# Patient Record
Sex: Female | Born: 1955 | Race: White | Hispanic: No | Marital: Married | Smoking: Never smoker
Health system: Southern US, Community
[De-identification: ages and names within clinical notes are randomized; demographics above are authoritative.]

## PROBLEM LIST (undated history)

## (undated) DIAGNOSIS — J45909 Unspecified asthma, uncomplicated: Secondary | ICD-10-CM

## (undated) DIAGNOSIS — E785 Hyperlipidemia, unspecified: Secondary | ICD-10-CM

## (undated) DIAGNOSIS — G43909 Migraine, unspecified, not intractable, without status migrainosus: Secondary | ICD-10-CM

## (undated) DIAGNOSIS — R000 Tachycardia, unspecified: Secondary | ICD-10-CM

## (undated) HISTORY — DX: Hyperlipidemia, unspecified: E78.5

## (undated) HISTORY — DX: Migraine, unspecified, not intractable, without status migrainosus: G43.909

## (undated) HISTORY — DX: Unspecified asthma, uncomplicated: J45.909

## (undated) HISTORY — DX: Tachycardia, unspecified: R00.0

---

## 1998-09-15 ENCOUNTER — Other Ambulatory Visit: Admission: RE | Admit: 1998-09-15 | Discharge: 1998-09-15 | Payer: Self-pay | Admitting: Obstetrics and Gynecology

## 1998-09-23 ENCOUNTER — Ambulatory Visit (HOSPITAL_BASED_OUTPATIENT_CLINIC_OR_DEPARTMENT_OTHER): Admission: RE | Admit: 1998-09-23 | Discharge: 1998-09-23 | Payer: Self-pay

## 2000-02-11 ENCOUNTER — Other Ambulatory Visit: Admission: RE | Admit: 2000-02-11 | Discharge: 2000-02-11 | Payer: Self-pay | Admitting: *Deleted

## 2000-03-24 ENCOUNTER — Encounter: Admission: RE | Admit: 2000-03-24 | Discharge: 2000-03-24 | Payer: Self-pay | Admitting: *Deleted

## 2000-03-24 ENCOUNTER — Encounter: Payer: Self-pay | Admitting: Allergy and Immunology

## 2001-03-20 ENCOUNTER — Other Ambulatory Visit: Admission: RE | Admit: 2001-03-20 | Discharge: 2001-03-20 | Payer: Self-pay | Admitting: Obstetrics and Gynecology

## 2001-05-12 ENCOUNTER — Encounter: Payer: Self-pay | Admitting: Emergency Medicine

## 2001-05-12 ENCOUNTER — Emergency Department (HOSPITAL_COMMUNITY): Admission: EM | Admit: 2001-05-12 | Discharge: 2001-05-12 | Payer: Self-pay | Admitting: Emergency Medicine

## 2002-12-10 ENCOUNTER — Other Ambulatory Visit: Admission: RE | Admit: 2002-12-10 | Discharge: 2002-12-10 | Payer: Self-pay | Admitting: Obstetrics and Gynecology

## 2002-12-12 ENCOUNTER — Encounter: Admission: RE | Admit: 2002-12-12 | Discharge: 2002-12-12 | Payer: Self-pay | Admitting: Obstetrics and Gynecology

## 2002-12-12 ENCOUNTER — Encounter: Payer: Self-pay | Admitting: Obstetrics and Gynecology

## 2005-05-09 ENCOUNTER — Encounter: Admission: RE | Admit: 2005-05-09 | Discharge: 2005-05-09 | Payer: Self-pay | Admitting: Orthopedic Surgery

## 2006-08-29 ENCOUNTER — Encounter: Admission: RE | Admit: 2006-08-29 | Discharge: 2006-08-29 | Payer: Self-pay | Admitting: Specialist

## 2007-04-06 ENCOUNTER — Ambulatory Visit (HOSPITAL_COMMUNITY): Admission: RE | Admit: 2007-04-06 | Discharge: 2007-04-07 | Payer: Self-pay | Admitting: Neurosurgery

## 2007-05-09 ENCOUNTER — Encounter: Admission: RE | Admit: 2007-05-09 | Discharge: 2007-05-09 | Payer: Self-pay | Admitting: Neurosurgery

## 2008-04-18 HISTORY — PX: OTHER SURGICAL HISTORY: SHX169

## 2009-09-03 ENCOUNTER — Encounter: Admission: RE | Admit: 2009-09-03 | Discharge: 2009-09-03 | Payer: Self-pay | Admitting: Neurosurgery

## 2010-07-31 ENCOUNTER — Emergency Department (HOSPITAL_COMMUNITY)
Admission: EM | Admit: 2010-07-31 | Discharge: 2010-07-31 | Disposition: A | Discharge status: Home / Self Care | Payer: No Typology Code available for payment source | Attending: Emergency Medicine | Admitting: Emergency Medicine

## 2010-07-31 ENCOUNTER — Emergency Department (HOSPITAL_COMMUNITY): Payer: No Typology Code available for payment source

## 2010-07-31 DIAGNOSIS — Y92410 Unspecified street and highway as the place of occurrence of the external cause: Secondary | ICD-10-CM | POA: Insufficient documentation

## 2010-07-31 DIAGNOSIS — Z9889 Other specified postprocedural states: Secondary | ICD-10-CM | POA: Insufficient documentation

## 2010-07-31 DIAGNOSIS — S139XXA Sprain of joints and ligaments of unspecified parts of neck, initial encounter: Secondary | ICD-10-CM | POA: Insufficient documentation

## 2010-08-31 NOTE — Op Note (Signed)
Megan, Peppel Michael                ACCOUNT NO.:  [id number]   MEDICAL RECORD NO.:  [id number]          PATIENT TYPE:  OIB   LOCATION:  3599                         FACILITY:  [facility]   PHYSICIAN:  Henry A. Pool, M.D.    DATE OF BIRTH:  09/22/1955   DATE OF PROCEDURE:  04/06/2007  DATE OF DISCHARGE:                               OPERATIVE REPORT   PREOPERATIVE DIAGNOSIS:  C5-C6 spondylosis with radiculopathy.   POSTOPERATIVE DIAGNOSIS:  C5-C6 spondylosis with radiculopathy.   PROCEDURE NOTE:  C5-C6 anterior cervical discectomy with C5-C6 anterior  interbody Prestige arthroplasty.   SURGEON:  Kathaleen Maser. Pool, M.D.   ASSISTANT:  Tia Alert, M.D.   ANESTHESIA:  General endotracheal anesthesia.   INDICATIONS FOR PROCEDURE:  Ms. Megan Michael is a 55 year old female with a  history of significant neck and left upper extremity pain failing  conservative management.  Workup done demonstrates evidence of  significant spondylosis with associated disc herniation off the left  side at C5-C6.  The patient has been counseled as to her options.  She  has decided to proceed with anterior cervical discectomy and  arthroplasty in hopes of improving her symptoms.   OPERATIVE NOTE:  The patient was taken to the operating room and placed  on the operating table in a supine position.  After an adequate level of  anesthesia was achieved, the patient was positioned supine with her neck  slightly extended and held in place with halter traction.  The patient's  anterior cervical region was prepped and draped sterilely.  A 10 blade  was used to make a linear skin incision overlying the C5-C6 interspace.  This was carried down sharply to the platysma.  The platysma was then  divided vertically and dissection proceeded on the medial border of the  sternomastoid muscle and carotid sheath.  The trachea and esophagus were  mobilized and retracted towards the left.  Prevertebral fascia was  stripped off the  anterior spinal column.  The longus coli muscle was  elevated bilaterally using electrocautery.  Deep self-retaining  retractor was placed.  Intraoperative fluoroscopy was used, the C5-C6  level was confirmed.  The disc space was then incised with a 15 blade in  rectangular fashion.  Wide disc space clean out was achieved using  pituitary rongeurs, forward and backward Carlen curettes, Kerrison  rongeurs, and high speed drill.  The predominance of the disc were  removed down to the level of the posterior annulus.  The microscope was  then used for the remainder of the discectomy.  The remaining aspects of  the annulus and osteophytes were removed using a high speed drill down  to the level of the posterior longitudinal ligament.  The posterior  longitudinal ligament was then elevated and resected in piecemeal  fashion using Kerrison rongeurs.  The underlying thecal sac was then  identified.  A wide central decompression was then performed by  undercutting the bodies of C5 and C6 using Kerrison rongeurs.  Decompression then proceeded out each neural foramen.  Wide anterior  foraminotomy was then performed along the course of the  exiting C6 nerve  roots bilaterally.  All elements of spondylitic protrusion and disc  herniation were completely resected.  At this point, a very thorough  decompression had been achieved.  Anterior osteophytes of the C5 and C6  vertebral bodies were removed using the high speed drill.  The disc  space was then sized and a 6 mm x 12 mm Prestige interbody arthroplasty  was then performed.  The device was impacted into place.  It was then  centered on the spine and attached using fixed angle screws, two each at  both C5 and C6.  All this was accomplished under fluoroscopic guidance.  The implant holder was then removed.  The plate appeared to be well  positioned.  The locking screws were then applied at both C5 and C6.  Final images revealed good position of the  implant with normal alignment  of the spine.  Intraoperative flexion of the patient's neck revealed  good movement at the operative level.  The wound was then inspected for  hemostasis which was found to be good, was irrigated one final time, and  closed in a typical fashion.  Steri-Strips and sterile dressings were  applied.  There were no complications.  The patient tolerated the  procedure well and she returns to the recovery room postoperatively.           ______________________________  Kathaleen Maser Pool, M.D.     HAP/MEDQ  D:  04/06/2007  T:  04/07/2007  Job:  [id number]

## 2011-01-21 LAB — DIFFERENTIAL
Basophils Absolute: 0
Basophils Relative: 1
Eosinophils Absolute: 0.1 — ABNORMAL LOW
Eosinophils Relative: 1
Lymphocytes Relative: 31
Lymphs Abs: 2.6
Monocytes Absolute: 0.6
Monocytes Relative: 8
Neutro Abs: 5
Neutrophils Relative %: 60

## 2011-01-21 LAB — CBC
HCT: 39.6
Hemoglobin: 13.5
MCHC: 34.1
MCV: 95.1
Platelets: 368
RBC: 4.17
RDW: 12.2
WBC: 8.3

## 2011-01-21 LAB — TYPE AND SCREEN
ABO/RH(D): O POS
Antibody Screen: NEGATIVE

## 2011-01-21 LAB — ABO/RH: ABO/RH(D): O POS

## 2013-12-09 ENCOUNTER — Encounter: Payer: Self-pay | Admitting: Cardiovascular Disease

## 2013-12-09 ENCOUNTER — Ambulatory Visit (INDEPENDENT_AMBULATORY_CARE_PROVIDER_SITE_OTHER): Payer: BLUE CROSS/BLUE SHIELD | Admitting: Cardiovascular Disease

## 2013-12-09 VITALS — BP 116/80 | HR 117 | Ht 65.0 in | Wt 140.8 lb

## 2013-12-09 DIAGNOSIS — R0989 Other specified symptoms and signs involving the circulatory and respiratory systems: Secondary | ICD-10-CM

## 2013-12-09 DIAGNOSIS — R0609 Other forms of dyspnea: Secondary | ICD-10-CM

## 2013-12-09 DIAGNOSIS — R000 Tachycardia, unspecified: Secondary | ICD-10-CM

## 2013-12-09 DIAGNOSIS — I498 Other specified cardiac arrhythmias: Secondary | ICD-10-CM

## 2013-12-09 DIAGNOSIS — R0600 Dyspnea, unspecified: Secondary | ICD-10-CM

## 2013-12-09 MED ORDER — METOPROLOL SUCCINATE ER 25 MG PO TB24: 25.0000 mg | ORAL_TABLET | Freq: Every day | ORAL | Status: DC

## 2013-12-09 NOTE — Assessment & Plan Note (Signed)
Megan Michael  presents for further evaluation of sinus tachycardia. At a faster rate for years. She's on several medications for chronic migraines and 1 for depression. The nortriptyline is known to cause tachycardia. Her doses been decreased slightly.  I would like  to get some basic labs including a TSH, basic metabolic profile, CBC, liver enzymes. We will also get an echocardiogram.  We will start her on toprol XL 25 mg a day.  I'll see her in 4-6 weeks for follow up visit.

## 2013-12-09 NOTE — Patient Instructions (Addendum)
Your physician recommends that you have lab work in: TODAY  Your physician has requested that you have an echocardiogram in 1-2 weeks. Echocardiography is a painless test that uses sound waves to create images of your heart. It provides your doctor with information about the size and shape of your heart and how well your heart's chambers and valves are working. This procedure takes approximately one hour. There are no restrictions for this procedure.  Your physician recommends that you schedule a follow-up appointment with Dr. Elease Hashimoto in: 4-6 weeks

## 2013-12-09 NOTE — Progress Notes (Signed)
     Megan Michael Date of Birth  Apr 28, 1955       [facility] Office [address]  [address] [place], [place]  [address]   [place], [place]  [address] [phone]     [phone]   Fax  [phone]     Fax [phone]  Problem List: 1. Sinus tachycardia 2.  Migraine headaches (chronic )  3.   History of Present Illness:  Megan Michael is a 58 yo who is seen for tachycardia.  She has noticed her HR is high.  She has lots of fatigue.  Also has chronic migraines. She does not get any regular exercise.  Does walk the dogs.    She has not had any heat or cold intolerance.  No unexplained weight gain or weight loss. No chronic diarrhea.     No current outpatient prescriptions on file prior to visit.   No current facility-administered medications on file prior to visit.    Allergies  Allergen Reactions  . Penicillins     RASH AND TONGUE SWELLING    Past Medical History  Diagnosis Date  . Migraines     Past Surgical History  Procedure Laterality Date  . Disk replacement  2010    CERVICAL SPINE     History  Smoking status  . Never Smoker   Smokeless tobacco  . Not on file    History  Alcohol Use: Not on file    Family History  Problem Relation Age of Onset  . Hypertension Mother   . Cancer Mother   . Diabetes Mellitus I Father   . Heart attack Father   . Cancer - Prostate Father   . Melanoma Father   . Diabetes Mellitus I Brother     Reviw of Systems:  Reviewed in the HPI.  All other systems are negative.  Physical Exam: Blood pressure 116/80, pulse 117, height  (1.651 m), weight 140 lb 12.8 oz (63.866 kg). Wt Readings from Last 3 Encounters:  12/09/13 140 lb 12.8 oz (63.866 kg)     General: Well developed, well nourished, in no acute distress.  Head: Normocephalic, atraumatic, sclera non-icteric, mucus membranes are moist,   Neck: Supple. Carotids are 2 + without bruits. No JVD   Lungs:  Clear   Heart: RR, normal S1S2, no murmur  Abdomen: Soft, non-tender, non-distended with normal bowel sounds.  Msk:  Strength and tone are normal   Extremities: No clubbing or cyanosis. No edema.  Distal pedal pulses are 2+ and equal    Neuro: CN II - XII intact.  Alert and oriented X 3.   Psych:  Normal   ECG: December 09, 2013  Sinus tachycardia at 117.  RAE.     Assessment / Plan:

## 2013-12-10 LAB — BASIC METABOLIC PANEL
BUN: 16 mg/dL (ref 6–23)
CO2: 26 mEq/L (ref 19–32)
Calcium: 9.6 mg/dL (ref 8.4–10.5)
Chloride: 105 mEq/L (ref 96–112)
Creatinine, Ser: 1.2 mg/dL (ref 0.4–1.2)
GFR: 48.82 mL/min — ABNORMAL LOW (ref >60.00)
Glucose, Bld: 90 mg/dL (ref 70–99)
Potassium: 3.7 mEq/L (ref 3.5–5.1)
Sodium: 139 mEq/L (ref 135–145)

## 2013-12-10 LAB — CBC WITH DIFFERENTIAL/PLATELET
Basophils Absolute: 0 10*3/uL (ref 0.0–0.1)
Basophils Relative: 0.5 % (ref 0.0–3.0)
Eosinophils Absolute: 0.1 10*3/uL (ref 0.0–0.7)
Eosinophils Relative: 1.4 % (ref 0.0–5.0)
HCT: 39 % (ref 36.0–46.0)
Hemoglobin: 13 g/dL (ref 12.0–15.0)
Lymphocytes Relative: 27.4 % (ref 12.0–46.0)
Lymphs Abs: 2.3 10*3/uL (ref 0.7–4.0)
MCHC: 33.3 g/dL (ref 30.0–36.0)
MCV: 96 fl (ref 78.0–100.0)
Monocytes Absolute: 0.4 10*3/uL (ref 0.1–1.0)
Monocytes Relative: 5.1 % (ref 3.0–12.0)
Neutro Abs: 5.6 10*3/uL (ref 1.4–7.7)
Neutrophils Relative %: 65.6 % (ref 43.0–77.0)
Platelets: 310 10*3/uL (ref 150.0–400.0)
RBC: 4.06 Mil/uL (ref 3.87–5.11)
RDW: 13 % (ref 11.5–15.5)
WBC: 8.5 10*3/uL (ref 4.0–10.5)

## 2013-12-10 LAB — HEPATIC FUNCTION PANEL
ALT: 21 U/L (ref 0–35)
AST: 23 U/L (ref 0–37)
Albumin: 4.5 g/dL (ref 3.5–5.2)
Alkaline Phosphatase: 69 U/L (ref 39–117)
Bilirubin, Direct: 0 mg/dL (ref 0.0–0.3)
Total Bilirubin: 0.4 mg/dL (ref 0.2–1.2)
Total Protein: 7.1 g/dL (ref 6.0–8.3)

## 2013-12-10 LAB — TSH: TSH: 0.79 u[IU]/mL (ref 0.35–4.50)

## 2013-12-19 ENCOUNTER — Ambulatory Visit (HOSPITAL_COMMUNITY): Payer: BC Managed Care – PPO | Attending: Cardiology | Admitting: Radiology

## 2013-12-19 DIAGNOSIS — R5381 Other malaise: Secondary | ICD-10-CM | POA: Insufficient documentation

## 2013-12-19 DIAGNOSIS — R9389 Abnormal findings on diagnostic imaging of other specified body structures: Secondary | ICD-10-CM | POA: Insufficient documentation

## 2013-12-19 DIAGNOSIS — I080 Rheumatic disorders of both mitral and aortic valves: Secondary | ICD-10-CM | POA: Insufficient documentation

## 2013-12-19 DIAGNOSIS — R0600 Dyspnea, unspecified: Secondary | ICD-10-CM

## 2013-12-19 DIAGNOSIS — R000 Tachycardia, unspecified: Secondary | ICD-10-CM | POA: Diagnosis present

## 2013-12-19 DIAGNOSIS — R5383 Other fatigue: Secondary | ICD-10-CM

## 2013-12-19 NOTE — Progress Notes (Signed)
Echocardiogram performed.  

## 2014-01-14 ENCOUNTER — Encounter: Payer: Self-pay | Admitting: Cardiovascular Disease

## 2014-01-14 ENCOUNTER — Ambulatory Visit (INDEPENDENT_AMBULATORY_CARE_PROVIDER_SITE_OTHER): Payer: BC Managed Care – PPO | Admitting: Cardiovascular Disease

## 2014-01-14 VITALS — BP 114/76 | HR 96 | Ht 65.0 in | Wt 141.0 lb

## 2014-01-14 DIAGNOSIS — R000 Tachycardia, unspecified: Secondary | ICD-10-CM

## 2014-01-14 DIAGNOSIS — I498 Other specified cardiac arrhythmias: Secondary | ICD-10-CM

## 2014-01-14 MED ORDER — METOPROLOL SUCCINATE ER 25 MG PO TB24: 75.0000 mg | ORAL_TABLET | Freq: Every day | ORAL | Status: DC

## 2014-01-14 NOTE — Assessment & Plan Note (Addendum)
Megan Hatchnn seems to be feeling a little bit better. Her rate has improved. Her neurologist increased the Toprol to 50 mg a day and she seems to be doing okay with that. We'll increase the Toprol 75 mg daily and continue to monitor her heart rate. I'll see her in 3 months for followup visit and EKG.

## 2014-01-14 NOTE — Patient Instructions (Signed)
Your physician has recommended you make the following change in your medication:  INCREASE Toprol to 75 mg once daily  Your physician recommends that you schedule a follow-up appointment in: 3 months with Dr. Elease HashimotoNahser

## 2014-01-14 NOTE — Progress Notes (Signed)
Megan DaltonAnn P Michael Date of Birth  Feb 15, 1956       [facility]    [place] Office [address]  [address] [place], [place]  [address]   [place]  [address] [phone]     [phone]   Fax  [phone]     Fax [phone]  Problem List: 1. Sinus tachycardia 2.  Migraine headaches (chronic )  3.   History of Present Illness:  Megan Michael is a 58 yo who is seen for tachycardia.  She has noticed her HR is high.  She has lots of fatigue.  Also has chronic migraines. She does not get any regular exercise.  Does walk the dogs.    She has not had any heat or cold intolerance.  No unexplained weight gain or weight loss. No chronic diarrhea.     Current Outpatient Prescriptions on File Prior to Visit  Medication Sig Dispense Refill  . fluticasone (FLONASE) 50 MCG/ACT nasal spray Place into both nostrils daily.      . meclizine (ANTIVERT) 12.5 MG tablet Take 12.5 mg by mouth 3 (three) times daily as needed for dizziness.      . nortriptyline (PAMELOR) 50 MG capsule Take 50 mg by mouth 2 (two) times daily.       . promethazine (PHENERGAN) 12.5 MG tablet Take 12.5 mg by mouth every 6 (six) hours as needed for nausea or vomiting.      . Riboflavin (VITAMIN B-2 PO) Take 400 mg by mouth daily.      . rizatriptan (MAXALT-MLT) 10 MG disintegrating tablet Take 10 mg by mouth as needed.       . TOPAMAX 100 MG tablet Take 100 mg by mouth daily.        No current facility-administered medications on file prior to visit.    Allergies  Allergen Reactions  . Penicillins     RASH AND TONGUE SWELLING    Past Medical History  Diagnosis Date  . Migraines     Past Surgical History  Procedure Laterality Date  . Disk replacement  2010    CERVICAL SPINE     History  Smoking status  . Never Smoker   Smokeless tobacco  . Not on file    History  Alcohol Use: Not on file    Family History  Problem Relation Age of Onset  . Hypertension  Mother   . Cancer Mother   . Diabetes Mellitus I Father   . Heart attack Father   . Cancer - Prostate Father   . Melanoma Father   . Diabetes Mellitus I Brother     Reviw of Systems:  Reviewed in the HPI.  All other systems are negative.  Physical Exam: Blood pressure 114/76, pulse 96, height 5\' 5"  (1.651 m), weight 141 lb (63.957 kg). Wt Readings from Last 3 Encounters:  01/14/14 141 lb (63.957 kg)  12/09/13 140 lb 12.8 oz (63.866 kg)     General: Well developed, well nourished, in no acute distress.  Head: Normocephalic, atraumatic, sclera non-icteric, mucus membranes are moist,   Neck: Supple. Carotids are 2 + without bruits. No JVD   Lungs: Clear   Heart: RR, normal S1S2, no murmur  Abdomen: Soft, non-tender, non-distended with normal bowel sounds.  Msk:  Strength and tone are normal   Extremities: No clubbing or cyanosis. No edema.  Distal pedal pulses are 2+ and equal    Neuro: CN II - XII intact.  Alert and oriented X 3.   Psych:  Normal   ECG: December 09, 2013  Sinus tachycardia at 117.  RAE.     Assessment / Plan:

## 2014-04-16 ENCOUNTER — Encounter: Payer: Self-pay | Admitting: Cardiovascular Disease

## 2014-04-16 ENCOUNTER — Ambulatory Visit (INDEPENDENT_AMBULATORY_CARE_PROVIDER_SITE_OTHER): Payer: BC Managed Care – PPO | Admitting: Cardiovascular Disease

## 2014-04-16 VITALS — BP 116/74 | HR 99 | Ht 65.0 in | Wt 146.4 lb

## 2014-04-16 DIAGNOSIS — R000 Tachycardia, unspecified: Secondary | ICD-10-CM

## 2014-04-16 DIAGNOSIS — I471 Supraventricular tachycardia: Secondary | ICD-10-CM

## 2014-04-16 NOTE — Assessment & Plan Note (Signed)
Megan Hatchnn seems to be doing fairly well. Her heart rate does tend to be a little bit fast. In talking with her, it is clear that she does not drink of water on a daily basis and  doesn't drink shen she works out  Encourage her to drink more water and to drink occasional V8 juice.  Will see her in 6 months.

## 2014-04-16 NOTE — Progress Notes (Signed)
Megan DaltonAnn P Oatis Date of Birth  10-22-1955       [facility]    [place] [facility] [address]  [address] [place], [place]  [address]   [place]  [address] [phone]     [phone]   Fax  [phone]     Fax [phone]  Problem List: 1. Sinus tachycardia 2.  Migraine headaches (chronic )  3.   History of Present Illness:  Megan Michael is a 58 yo who is seen for tachycardia.  She has noticed her HR is high.  She has lots of fatigue.  Also has chronic migraines. She does not get any regular exercise.  Does walk the dogs.    She has not had any heat or cold intolerance.  No unexplained weight gain or weight loss. No chronic diarrhea.    Dec. 30, 2015:  Megan Michael is a 58 yo with chronic sinus tachycardia.  No changes in her HR. HR stays active.  Typically in the 90's to 100s with household chores.   HR increases to 120 on the treadmill.    Current Outpatient Prescriptions on File Prior to Visit  Medication Sig Dispense Refill  . fluticasone (FLONASE) 50 MCG/ACT nasal spray Place into both nostrils daily.    . meclizine (ANTIVERT) 12.5 MG tablet Take 12.5 mg by mouth 3 (three) times daily as needed for dizziness.    . metoprolol succinate (TOPROL-XL) 25 MG 24 hr tablet Take 3 tablets (75 mg total) by mouth daily. 270 tablet 3  . nortriptyline (PAMELOR) 50 MG capsule Take 50 mg by mouth 2 (two) times daily.     . promethazine (PHENERGAN) 12.5 MG tablet Take 12.5 mg by mouth every 6 (six) hours as needed for nausea or vomiting.    . Riboflavin (VITAMIN B-2 PO) Take 400 mg by mouth daily.    . rizatriptan (MAXALT-MLT) 10 MG disintegrating tablet Take 10 mg by mouth as needed.     . TOPAMAX 100 MG tablet Take 100 mg by mouth daily.      No current facility-administered medications on file prior to visit.    Allergies  Allergen Reactions  . Penicillins     RASH AND TONGUE SWELLING    Past Medical History  Diagnosis Date  .  Migraines     Past Surgical History  Procedure Laterality Date  . Disk replacement  2010    CERVICAL SPINE     History  Smoking status  . Never Smoker   Smokeless tobacco  . Not on file    History  Alcohol Use: Not on file    Family History  Problem Relation Age of Onset  . Hypertension Mother   . Cancer Mother   . Diabetes Mellitus I Father   . Heart attack Father   . Cancer - Prostate Father   . Melanoma Father   . Diabetes Mellitus I Brother     Reviw of Systems:  Reviewed in the HPI.  All other systems are negative.  Physical Exam: Blood pressure 116/74, pulse 99, height 5\' 5"  (1.651 m), weight 146 lb 6.4 oz (66.407 kg), SpO2 99 %. Wt Readings from Last 3 Encounters:  04/16/14 146 lb 6.4 oz (66.407 kg)  01/14/14 141 lb (63.957 kg)  12/09/13 140 lb 12.8 oz (63.866 kg)     General: Well developed, well nourished, in no acute distress.  Head: Normocephalic, atraumatic, sclera non-icteric, mucus membranes are moist,   Neck:  Supple. Carotids are 2 + without bruits. No JVD   Lungs: Clear   Heart: RR, normal S1S2, no murmur  Abdomen: Soft, non-tender, non-distended with normal bowel sounds.  Msk:  Strength and tone are normal   Extremities: No clubbing or cyanosis. No edema.  Distal pedal pulses are 2+ and equal    Neuro: CN II - XII intact.  Alert and oriented X 3.   Psych:  Normal   ECG: December 09, 2013  Sinus tachycardia at 117.  RAE.     Assessment / Plan:

## 2014-04-16 NOTE — Patient Instructions (Signed)
Increase your intake of water and you are advised to drink a can of V8 juice several times a week.   Your physician wants you to follow-up in: 6 months with Dr. Elease HashimotoNahser. You will receive a reminder letter in the mail two months in advance. If you don't receive a letter, please call our office to schedule the follow-up appointment.

## 2014-10-23 ENCOUNTER — Ambulatory Visit (INDEPENDENT_AMBULATORY_CARE_PROVIDER_SITE_OTHER): Payer: BLUE CROSS/BLUE SHIELD | Admitting: Cardiovascular Disease

## 2014-10-23 ENCOUNTER — Encounter: Payer: Self-pay | Admitting: Cardiovascular Disease

## 2014-10-23 VITALS — BP 110/78 | HR 73 | Ht 65.0 in | Wt 138.8 lb

## 2014-10-23 DIAGNOSIS — R000 Tachycardia, unspecified: Secondary | ICD-10-CM

## 2014-10-23 DIAGNOSIS — I471 Supraventricular tachycardia: Secondary | ICD-10-CM

## 2014-10-23 NOTE — Patient Instructions (Addendum)
Medication Instructions:  Your physician recommends that you continue on your current medications as directed. Please refer to the Current Medication list given to you today.   Labwork: None Ordered   Testing/Procedures: None Ordered   Follow-Up: Your physician wants you to follow-up in: 1 year with Dr. Nahser.  You will receive a reminder letter in the mail two months in advance. If you don't receive a letter, please call our office to schedule the follow-up appointment.      

## 2014-10-23 NOTE — Progress Notes (Signed)
Megan DaltonAnn P Wirick Date of Birth  Jan 19, 1956       [facility]    [place] Office [address]  [address] [place], [place]  [address]   [place], [place]  [address] [phone]     [phone]   Fax  [phone]     Fax [phone]  Problem List: 1. Sinus tachycardia 2.  Migraine headaches (chronic )  3.   History of Present Illness:  Megan Michael is a 59 yo who is seen for tachycardia.  She has noticed her HR is high.  She has lots of fatigue.  Also has chronic migraines. She does not get any regular exercise.  Does walk the dogs.    She has not had any heat or cold intolerance.  No unexplained weight gain or weight loss. No chronic diarrhea.    Dec. 30, 2015:  Megan Michael is a 59 yo with chronic sinus tachycardia.  No changes in her HR. HR stays active.  Typically in the 90's to 100s with household chores.   HR increases to 120 on the treadmill.  October 23, 2014:    Megan Michael is doing ok.  Is seen back for her sinus tachycardia .  Exercises without problems  - occasionally brings on a migraine.   No cardiac concerns   Current Outpatient Prescriptions on File Prior to Visit  Medication Sig Dispense Refill  . fluticasone (FLONASE) 50 MCG/ACT nasal spray Place into both nostrils daily.    . meclizine (ANTIVERT) 12.5 MG tablet Take 12.5 mg by mouth 3 (three) times daily as needed for dizziness.    . metoprolol succinate (TOPROL-XL) 25 MG 24 hr tablet Take 3 tablets (75 mg total) by mouth daily. 270 tablet 3  . Probiotic Product (PROBIOTIC DAILY PO) Take by mouth daily.    . promethazine (PHENERGAN) 12.5 MG tablet Take 12.5 mg by mouth every 6 (six) hours as needed for nausea or vomiting.    . Riboflavin (VITAMIN B-2 PO) Take 400 mg by mouth daily.    . rizatriptan (MAXALT-MLT) 10 MG disintegrating tablet Take 10 mg by mouth as needed.     . TOPAMAX 100 MG tablet Take 100 mg by mouth daily.      No current facility-administered medications on file prior  to visit.    Allergies  Allergen Reactions  . Penicillins     RASH AND TONGUE SWELLING    Past Medical History  Diagnosis Date  . Migraines     Past Surgical History  Procedure Laterality Date  . Disk replacement  2010    CERVICAL SPINE     History  Smoking status  . Never Smoker   Smokeless tobacco  . Not on file    History  Alcohol Use: Not on file    Family History  Problem Relation Age of Onset  . Hypertension Mother   . Cancer Mother   . Diabetes Mellitus I Father   . Heart attack Father   . Cancer - Prostate Father   . Melanoma Father   . Diabetes Mellitus I Brother     Reviw of Systems:  Reviewed in the HPI.  All other systems are negative.  Physical Exam: Blood pressure 110/78, pulse 73, height 5\' 5"  (1.651 m), weight 62.959 kg (138 lb 12.8 oz). Wt Readings from Last 3 Encounters:  10/23/14 62.959 kg (138 lb 12.8 oz)  04/16/14 66.407 kg (146 lb 6.4 oz)  01/14/14 63.957 kg (141 lb)  General: Well developed, well nourished, in no acute distress.  Head: Normocephalic, atraumatic, sclera non-icteric, mucus membranes are moist,   Neck: Supple. Carotids are 2 + without bruits. No JVD   Lungs: Clear   Heart: RR, normal S1S2, no murmur  Abdomen: Soft, non-tender, non-distended with normal bowel sounds.  Msk:  Strength and tone are normal   Extremities: No clubbing or cyanosis. No edema.  Distal pedal pulses are 2+ and equal    Neuro: CN II - XII intact.  Alert and oriented X 3.   Psych:  Normal   ECG: October 23, 2014:  NSR at 31.  NS ST wave abn.   Assessment and Plan :    1. Sinus tachycardia - HR is well controlled ,  Tolerating the Toprol well.   continue same dose.  Encouraged her to exercise as much as possible   2.  Migraine headaches (chronic )    Nahser, Deloris Ping, MD  10/23/2014 11:06 AM    [facility] [address] [place],  [address] [place], [place]  [address] Pager [phone] Phone: [phone];  Fax: [phone]   [facility]  [address] [place], [place]  [address] [phone]    Fax [phone]   Assessment / Plan:   1. Sinus tachycardia - her HR is very well controlled on the current dose of metoprolol. Continue current meds. I will see in 1 year  2.  Migraine headaches (chronic )       Nahser, Deloris Ping, MD  10/23/2014 9:02 PM    [facility] [address] [place],  [address] [place], [place]  [address] Pager [phone] Phone: [phone]; Fax: [phone]   [facility]  [address] [place], [place]  [address] [phone]    Fax [phone]

## 2014-12-16 ENCOUNTER — Other Ambulatory Visit: Payer: Self-pay | Admitting: Cardiovascular Disease

## 2015-07-31 DIAGNOSIS — G243 Spasmodic torticollis: Secondary | ICD-10-CM | POA: Diagnosis not present

## 2015-07-31 DIAGNOSIS — G43719 Chronic migraine without aura, intractable, without status migrainosus: Secondary | ICD-10-CM | POA: Diagnosis not present

## 2015-08-11 ENCOUNTER — Other Ambulatory Visit: Payer: Self-pay

## 2015-08-11 DIAGNOSIS — Z1231 Encounter for screening mammogram for malignant neoplasm of breast: Secondary | ICD-10-CM

## 2015-08-17 DIAGNOSIS — H5213 Myopia, bilateral: Secondary | ICD-10-CM | POA: Diagnosis not present

## 2015-08-31 ENCOUNTER — Ambulatory Visit
Admission: RE | Admit: 2015-08-31 | Discharge: 2015-08-31 | Disposition: A | Discharge status: Home / Self Care | Payer: BLUE CROSS/BLUE SHIELD | Source: Ambulatory Visit

## 2015-08-31 DIAGNOSIS — Z1231 Encounter for screening mammogram for malignant neoplasm of breast: Secondary | ICD-10-CM | POA: Diagnosis not present

## 2015-09-01 ENCOUNTER — Encounter: Payer: Self-pay | Admitting: Genetic Counselor

## 2015-09-01 ENCOUNTER — Telehealth: Payer: Self-pay | Admitting: Genetic Counselor

## 2015-09-01 NOTE — Telephone Encounter (Signed)
Pt called in to obtain an appointment for genetics due to cancer in family, intake was completed and all info verified.  Appointment was confirmed and pt wants to bring son to appt. Mailed new pt packet

## 2015-10-06 ENCOUNTER — Encounter: Payer: Self-pay | Admitting: Genetic Counselor

## 2015-10-06 ENCOUNTER — Other Ambulatory Visit: Payer: BLUE CROSS/BLUE SHIELD

## 2015-10-06 ENCOUNTER — Ambulatory Visit (HOSPITAL_BASED_OUTPATIENT_CLINIC_OR_DEPARTMENT_OTHER): Payer: BLUE CROSS/BLUE SHIELD | Admitting: Genetic Counselor

## 2015-10-06 DIAGNOSIS — Z8042 Family history of malignant neoplasm of prostate: Secondary | ICD-10-CM

## 2015-10-06 DIAGNOSIS — Z315 Encounter for genetic counseling: Secondary | ICD-10-CM | POA: Diagnosis not present

## 2015-10-06 DIAGNOSIS — Z800 Family history of malignant neoplasm of digestive organs: Secondary | ICD-10-CM

## 2015-10-06 DIAGNOSIS — Z808 Family history of malignant neoplasm of other organs or systems: Secondary | ICD-10-CM | POA: Diagnosis not present

## 2015-10-06 DIAGNOSIS — Z803 Family history of malignant neoplasm of breast: Secondary | ICD-10-CM | POA: Diagnosis not present

## 2015-10-06 NOTE — Progress Notes (Signed)
REFERRING PROVIDER: Lona Kettle, MD [place], [place] [address]  PRIMARY PROVIDER:   Melinda Crutch, MD  PRIMARY REASON FOR VISIT:  1. Family history of breast cancer in mother   2. Family history of prostate cancer   3. Family history of skin cancer   4. Family history of colon cancer      HISTORY OF PRESENT ILLNESS:   Megan Michael, a 61 y.o. female, was seen for a [place] cancer genetics consultation per her own request due to her family history of breast, prostate, and other cancers.  Megan Michael presents to clinic today to discuss the possibility of a hereditary predisposition to cancer, genetic testing, and to further clarify her future cancer risks, as well as potential cancer risks for family members.    Megan Michael is a 60 y.o. female with no personal history of cancer.     HORMONAL RISK FACTORS:  Menarche was at age 80.  First live birth at age 8.  OCP use for approximately 30 years.  Ovaries intact: yes.  Hysterectomy: no.  Menopausal status: postmenopausal.  HRT use: 0 years. Colonoscopy: yes; most recent was approximately 9 years ago; no history of polyps; is on a 5-10 year schedule. Mammogram within the last year: yes - was normal in 08/2015, reports dense breast tissue Number of breast biopsies: 0. Up to date with pelvic exams:  no. Any excessive radiation exposure/other exposures in the past:  no, but does report some history of secondhand smoke exposure while growing up  Past Medical History  Diagnosis Date  . Migraines     Past Surgical History  Procedure Laterality Date  . Disk replacement  2010    CERVICAL SPINE     Social History   Social History  . Marital Status: Married    Spouse Name: N/A  . Number of Children: N/A  . Years of Education: N/A   Social History Main Topics  . Smoking status: Never Smoker   . Smokeless tobacco: Never Used  . Alcohol Use: No     Comment: "not really"  . Drug Use: None  . Sexual Activity:  Not Asked   Other Topics Concern  . None   Social History Narrative     FAMILY HISTORY:  We obtained a detailed, 4-generation family history.  Significant diagnoses are listed below: Family History  Problem Relation Age of Onset  . Hypertension Mother   . Breast cancer Mother 71    w/ recurrence and metastasis to intestines at 88  . Diabetes Mellitus I Father   . Heart attack Father     +EtOH  . Cancer - Prostate Father     dx. mid-late 42s  . Skin cancer Father     NOS type dx. later age  . Colon cancer Father     dx. after prostate cancer  . Diabetes Mellitus I Brother   . Prostate cancer Brother 64    unspecified Gleason score  . Bipolar disorder Maternal Uncle   . Stroke Maternal Grandfather   . Heart disease Maternal Grandfather   . Diabetes Mellitus I Brother   . Prostate cancer Brother 54    unspecified Gleason score; +surgery  . Other Brother     says he has prostate cancer, but pt not so sure    Megan Michael has three sons, ages 22-28.  She has three full brothers, ages 6-66.  Two brothers have been diagnosed with prostate cancer at ages 9 and 70.  She is not sure of Gleason scores for these brothers.  She also reports that her oldest brother reports a history of prostate cancer, but she does not know if this is actually the case.  Ms. Dancy mother was diagnosed with breast cancer at 53; this cancer recurred at age 10 and metastasized to her intestines.  She passed away at age 83.  Ms. Sprowl reports that her mother had a history of progestin use.  Ms. Mayor father died at age 2.  He was diagnosed with prostate cancer in his mid-late 28s.  Following this, he was diagnosed with colon and skin cancers.  Ms. Shadduck does report that her father had a history of heavy alcohol use.    Megan Michael has no information for any additional paternal family members, as her father was an orphan.  Megan Michael mother had one full brother.  He died at age 38 from  suicide and had a history of bipolar disorder.  He had two daughters and one son who have not had cancer.  Megan Michael maternal grandmother died at age 19.  She had one sister who never had cancer.  Her maternal grandfather died of a stroke and heart disease in his 22s.  He had two siblings who never had cancer to Megan Michael's knowledge.  She had no information for her maternal great grandparents.  She is unaware of any previous family history of genetic testing for hereditary cancer.  Patient's maternal ancestors are of Software engineer and English descent, and paternal ancestors are of Caucasian descent. There is no reported Ashkenazi Jewish ancestry. There is no known consanguinity.  GENETIC COUNSELING ASSESSMENT: Megan Michael is a 60 y.o. female with a family history of cancer which is somewhat suggestive of a hereditary cancer syndrome and predisposition to cancer. We, therefore, discussed and recommended the following at today's visit.   DISCUSSION: We reviewed the characteristics, features and inheritance patterns of hereditary cancer syndromes, particularly those caused by mutations within the BRCA1/2 genes. We also discussed genetic testing, including the appropriate family members to test, the process of testing, insurance coverage and turn-around-time for results. We discussed the implications of a negative, positive and/or variant of uncertain significant result. We recommended Megan Michael pursue genetic testing for the 20-gene Breast/Ovarian Cancer Panel through [facility].  The Breast/Ovarian Cancer Panel offered by [facility] Hope Pigeon, MD) includes sequencing and deletion/duplication analysis for the following 19 genes:  ATM, BARD1, BRCA1, BRCA2, BRIP1, CDH1, CHEK2, FANCC, MLH1, MSH2, MSH6, NBN, PALB2, PMS2, PTEN, RAD51C, RAD51D, TP53, and XRCC2.  This panel also includes deletion/duplication analysis (without sequencing) for one gene, EPCAM.   Based on Ms.  Michael's family history of cancer, she meets medical criteria for genetic testing. Despite that she meets criteria, she may still have an out of pocket cost. We discussed that if her out of pocket cost for testing is over $100, the laboratory will call and confirm whether she wants to proceed with testing.  If the out of pocket cost of testing is less than $100 she will be billed by the genetic testing laboratory.   PLAN: After considering the risks, benefits, and limitations, Megan Michael  provided informed consent to pursue genetic testing and the blood sample was sent to [facility] for analysis of the 20-gene Breast/Ovarian Cancer Panel. Results should be available within approximately 2-3 weeks' time, at which point they will be disclosed by telephone to Megan Michael, as will any additional recommendations warranted by these results. Megan Michael  will receive a summary of her genetic counseling visit and a copy of her results once available. This information will also be available in Epic. We encouraged Megan Michael to remain in contact with cancer genetics annually so that we can continuously update the family history and inform her of any changes in cancer genetics and testing that may be of benefit for her family. Megan Michael questions were answered to her satisfaction today. Our contact information was provided should additional questions or concerns arise.  Thank you for the referral and allowing [place] to share in the care of your patient.   Megan Luz, MS, [facility] Certified Genetic Counselor [email or web address] Phone: [phone]  The patient was seen for a total of 60 minutes in face-to-face genetic counseling.  This patient was discussed with Drs. Michael, Lindi Adie and/or Burr Medico who agrees with the above.    _______________________________________________________________________ For Office Staff:  Number of people involved in session: 2 Was an Intern/ student involved with  case: yes

## 2015-10-14 DIAGNOSIS — G243 Spasmodic torticollis: Secondary | ICD-10-CM | POA: Diagnosis not present

## 2015-10-14 DIAGNOSIS — G43719 Chronic migraine without aura, intractable, without status migrainosus: Secondary | ICD-10-CM | POA: Diagnosis not present

## 2015-10-16 DIAGNOSIS — Z8042 Family history of malignant neoplasm of prostate: Secondary | ICD-10-CM | POA: Diagnosis not present

## 2015-10-16 DIAGNOSIS — Z803 Family history of malignant neoplasm of breast: Secondary | ICD-10-CM | POA: Diagnosis not present

## 2015-10-16 DIAGNOSIS — Z800 Family history of malignant neoplasm of digestive organs: Secondary | ICD-10-CM | POA: Diagnosis not present

## 2015-10-24 DIAGNOSIS — R300 Dysuria: Secondary | ICD-10-CM | POA: Diagnosis not present

## 2015-10-24 DIAGNOSIS — N390 Urinary tract infection, site not specified: Secondary | ICD-10-CM | POA: Diagnosis not present

## 2015-11-03 DIAGNOSIS — G43719 Chronic migraine without aura, intractable, without status migrainosus: Secondary | ICD-10-CM | POA: Diagnosis not present

## 2015-11-03 DIAGNOSIS — G243 Spasmodic torticollis: Secondary | ICD-10-CM | POA: Diagnosis not present

## 2015-11-06 ENCOUNTER — Telehealth: Payer: Self-pay | Admitting: Genetic Counselor

## 2015-11-06 NOTE — Telephone Encounter (Signed)
Discussed with Ms. Megan Michael that her genetic test results were negative for pathogenic mutations within any of 20 genes on the Breast/Ovarian Cancer Panel through [facility].  Additionally, no uncertain changes were found.  She should continue to follow her doctors' recommendations for future cancer screening.  Her brothers are still eligible for testing due to their prostate cancer history.  Ms. Megan Michael's sons should make their doctors aware of the family history of prostate cancer, as they are at some increased risk due to family history.  Ms. Megan Michael would like a copy of her test result emailed, along with a results letter.  I am happy to do that.

## 2015-11-09 ENCOUNTER — Ambulatory Visit: Payer: Self-pay | Admitting: Genetic Counselor

## 2015-11-09 DIAGNOSIS — Z8042 Family history of malignant neoplasm of prostate: Secondary | ICD-10-CM

## 2015-11-09 DIAGNOSIS — Z1379 Encounter for other screening for genetic and chromosomal anomalies: Secondary | ICD-10-CM

## 2015-11-09 DIAGNOSIS — Z800 Family history of malignant neoplasm of digestive organs: Secondary | ICD-10-CM

## 2015-11-09 DIAGNOSIS — Z803 Family history of malignant neoplasm of breast: Secondary | ICD-10-CM

## 2015-11-16 ENCOUNTER — Encounter: Payer: Self-pay | Admitting: Genetic Counselor

## 2015-12-06 ENCOUNTER — Other Ambulatory Visit: Payer: Self-pay | Admitting: Cardiovascular Disease

## 2016-01-06 ENCOUNTER — Encounter: Payer: Self-pay | Admitting: Cardiovascular Disease

## 2016-01-11 DIAGNOSIS — Z1379 Encounter for other screening for genetic and chromosomal anomalies: Secondary | ICD-10-CM | POA: Insufficient documentation

## 2016-01-11 NOTE — Progress Notes (Signed)
GENETIC TEST RESULT  HPI: Ms. Kuchenbecker was previously seen in the [place] clinic due to a family history of breast and prostate cancer and concerns regarding a hereditary predisposition to cancer. Please refer to our prior cancer genetics clinic note from October 06, 2015 for more information regarding Ms. Beville's medical, social and family histories, and our assessment and recommendations, at the time. Ms. Witkop recent genetic test results were disclosed to her, as were recommendations warranted by these results. These results and recommendations are discussed in more detail below.  GENETIC TEST RESULTS: At the time of Ms. Konczal's visit on 10/06/15, we recommended she pursue genetic testing of the 20-gene Breast/Ovarian Cancer Panel through [facility]. The Breast/Ovarian Cancer Panel offered by [facility] Hope Pigeon, MD) includes sequencing and deletion/duplication analysis for the following 19 genes:  ATM, BARD1, BRCA1, BRCA2, BRIP1, CDH1, CHEK2, FANCC, MLH1, MSH2, MSH6, NBN, PALB2, PMS2, PTEN, RAD51C, RAD51D, TP53, and XRCC2.  This panel also includes deletion/duplication analysis (without sequencing) for one gene, EPCAM.  Those results are now back, the report date for which is November 05, 2015.  Genetic testing was normal, and did not reveal a deleterious mutation in these genes. Additionally, no variants of uncertain significance (VUSes) were found.  The test report will be scanned into EPIC and will be located under the Results Review tab in the Pathology>Molecular Pathology section.   We discussed with Ms. Fannin that since the current genetic testing is not perfect, it is possible there may be a gene mutation in one of these genes that current testing cannot detect, but that chance is small. We also discussed, that it is possible that another gene that has not yet been discovered, or that we have not yet tested, is responsible for the cancer diagnoses  in the family, and it is, therefore, important to remain in touch with cancer genetics in the future so that we can continue to offer Ms. Ligman the most up-to-date genetic testing.   CANCER SCREENING RECOMMENDATIONS: This normal result is reassuring and indicates that Ms. Warbington does not likely have an increased risk of cancer due to a mutation in one of these genes.  We, therefore, recommended  Ms. Kallio continue to follow the cancer screening guidelines provided by her primary healthcare providers.   RECOMMENDATIONS FOR FAMILY MEMBERS: Women in this family might be at some increased risk of developing cancer, over the general population risk, simply due to the family history of cancer. We recommended women in this family have a yearly mammogram beginning at age 3, or 57 years younger than the earliest onset of cancer, an annual clinical breast exam, and perform monthly breast self-exams. Women in this family should also have a gynecological exam as recommended by their primary provider. All family members should have a colonoscopy by age 55, or earlier, as recommended by their provider.  Ms. Niblack sons and nephews should make their doctors ware of the family history of prostate cancer, as they are at some increased risk due to the family history.  Based on Ms. Bensman's family history, we recommended her brothers, who have a history of prostate cancer, have genetic counseling and testing. Ms. Fellows will let us know if we can be of any assistance in coordinating genetic counseling and/or testing for these family members.   FOLLOW-UP: Lastly, we discussed with Ms. Nielson that cancer genetics is a rapidly advancing field and it is possible that new genetic tests will be appropriate for her and/or her  family members in the future. We encouraged her to remain in contact with cancer genetics on an annual basis so we can update her personal and family histories and let her know of advances  in cancer genetics that may benefit this family.   Our contact number was provided. Ms. Lienhard questions were answered to her satisfaction, and she knows she is welcome to call us at anytime with additional questions or concerns.   Jeanine Luz, MS, [facility] Certified Genetic Counselor [email or web address] Phone: [phone]

## 2016-01-18 DIAGNOSIS — G43719 Chronic migraine without aura, intractable, without status migrainosus: Secondary | ICD-10-CM | POA: Diagnosis not present

## 2016-01-18 DIAGNOSIS — G243 Spasmodic torticollis: Secondary | ICD-10-CM | POA: Diagnosis not present

## 2016-01-20 ENCOUNTER — Ambulatory Visit (INDEPENDENT_AMBULATORY_CARE_PROVIDER_SITE_OTHER): Payer: BLUE CROSS/BLUE SHIELD | Admitting: Cardiovascular Disease

## 2016-01-20 ENCOUNTER — Encounter: Payer: Self-pay | Admitting: Cardiovascular Disease

## 2016-01-20 VITALS — BP 110/80 | HR 66 | Ht 64.75 in | Wt 126.6 lb

## 2016-01-20 DIAGNOSIS — Z13220 Encounter for screening for lipoid disorders: Secondary | ICD-10-CM

## 2016-01-20 DIAGNOSIS — R000 Tachycardia, unspecified: Secondary | ICD-10-CM

## 2016-01-20 LAB — LIPID PANEL
Cholesterol: 240 mg/dL — ABNORMAL HIGH (ref 125–200)
HDL: 68 mg/dL (ref >=46)
LDL Cholesterol: 156 mg/dL — ABNORMAL HIGH (ref <130)
Total CHOL/HDL Ratio: 3.5 Ratio (ref <=5.0)
Triglycerides: 82 mg/dL (ref <150)
VLDL: 16 mg/dL (ref <30)

## 2016-01-20 LAB — COMPREHENSIVE METABOLIC PANEL
ALT: 12 U/L (ref 6–29)
AST: 17 U/L (ref 10–35)
Albumin: 4.6 g/dL (ref 3.6–5.1)
Alkaline Phosphatase: 63 U/L (ref 33–130)
BUN: 11 mg/dL (ref 7–25)
CO2: 20 mmol/L (ref 20–31)
Calcium: 10 mg/dL (ref 8.6–10.4)
Chloride: 109 mmol/L (ref 98–110)
Creat: 0.93 mg/dL (ref 0.50–1.05)
Glucose, Bld: 81 mg/dL (ref 65–99)
Potassium: 4.4 mmol/L (ref 3.5–5.3)
Sodium: 142 mmol/L (ref 135–146)
Total Bilirubin: 0.5 mg/dL (ref 0.2–1.2)
Total Protein: 6.6 g/dL (ref 6.1–8.1)

## 2016-01-20 LAB — TSH: TSH: 1.58 mIU/L

## 2016-01-20 NOTE — Patient Instructions (Signed)
Medication Instructions:  Your physician recommends that you continue on your current medications as directed. Please refer to the Current Medication list given to you today.   Labwork: TODAY - TSH, cholesterol, complete metabolic panel   Testing/Procedures: None Ordered   Follow-Up: Your physician wants you to follow-up in: 1 year with Dr. Elease HashimotoNahser.  You will receive a reminder letter in the mail two months in advance. If you don't receive a letter, please call our office to schedule the follow-up appointment.   If you need a refill on your cardiac medications before your next appointment, please call your pharmacy.   Thank you for choosing [facility]! Eligha BridegroomMichelle Swinyer, RN [phone]

## 2016-01-20 NOTE — Progress Notes (Signed)
William Dalton Date of Birth  04-Aug-1955       [facility] Office [address], [address]  [address], [address] [place], [place]  [address]   [place], [place]  [address] [phone]     [phone]   Fax  [phone]     Fax [phone]  Problem List: 1. Sinus tachycardia 2.  Migraine headaches (chronic )  3.   History of Present Illness:  Neira is a 60 yo who is seen for tachycardia.  She has noticed her HR is high.  She has lots of fatigue.  Also has chronic migraines. She does not get any regular exercise.  Does walk the dogs.    She has not had any heat or cold intolerance.  No unexplained weight gain or weight loss. No chronic diarrhea.    Dec. 30, 2015:  Taimi is a 60 yo with chronic sinus tachycardia.  No changes in her HR. HR stays active.  Typically in the 90's to 100s with household chores.   HR increases to 120 on the treadmill.  October 23, 2014:    Ellese is doing ok.  Is seen back for her sinus tachycardia .  Exercises without problems  - occasionally brings on a migraine.   No cardiac concerns  Oct. 4, 2017:  Doing well [name] her left meniscus , eventually had surgery in Feb.  Doing better now.     Current Outpatient Prescriptions on File Prior to Visit  Medication Sig Dispense Refill  . buPROPion (WELLBUTRIN XL) 300 MG 24 hr tablet Take 300 mg by mouth daily.  5  . fluticasone (FLONASE) 50 MCG/ACT nasal spray Place into both nostrils daily.    . meclizine (ANTIVERT) 12.5 MG tablet Take 12.5 mg by mouth 3 (three) times daily as needed for dizziness.    . metoprolol succinate (TOPROL-XL) 25 MG 24 hr tablet TAKE 3 TABLETS BY MOUTH EVERY DAY 270 tablet 3  . promethazine (PHENERGAN) 12.5 MG tablet Take 12.5 mg by mouth every 6 (six) hours as needed for nausea or vomiting.    . Riboflavin (VITAMIN B-2 PO) Take 400 mg by mouth daily.    . rizatriptan (MAXALT-MLT) 10 MG disintegrating tablet Take 10 mg by mouth as needed.     .  TOPAMAX 100 MG tablet Take 100 mg by mouth daily.      No current facility-administered medications on file prior to visit.     Allergies  Allergen Reactions  . Penicillins     RASH AND TONGUE SWELLING    Past Medical History:  Diagnosis Date  . Migraines     Past Surgical History:  Procedure Laterality Date  . DISK REPLACEMENT  2010   CERVICAL SPINE     History  Smoking Status  . Never Smoker  Smokeless Tobacco  . Never Used    History  Alcohol Use No    Comment: "not really"    Family History  Problem Relation Age of Onset  . Hypertension Mother   . Breast cancer Mother 62    w/ recurrence and metastasis to intestines at 76  . Diabetes Mellitus I Father   . Heart attack Father     +EtOH  . Cancer - Prostate Father     dx. mid-late 62s  . Skin cancer Father     NOS type dx. later age  . Colon cancer Father     dx. after prostate cancer  .  Diabetes Mellitus I Brother   . Prostate cancer Brother 64    unspecified Gleason score  . Bipolar disorder Maternal Uncle   . Stroke Maternal Grandfather   . Heart disease Maternal Grandfather   . Diabetes Mellitus I Brother   . Prostate cancer Brother 54    unspecified Gleason score; +surgery  . Other Brother     says he has prostate cancer, but pt not so sure    Reviw of Systems:  Reviewed in the HPI.  All other systems are negative.  Physical Exam: Blood pressure 110/80, pulse 66, height 5' 4.75" (1.645 m), weight 126 lb 9.6 oz (57.4 kg). Wt Readings from Last 3 Encounters:  01/20/16 126 lb 9.6 oz (57.4 kg)  10/23/14 138 lb 12.8 oz (63 kg)  04/16/14 146 lb 6.4 oz (66.4 kg)     General: Well developed, well nourished, in no acute distress.  Head: Normocephalic, atraumatic, sclera non-icteric, mucus membranes are moist,   Neck: Supple. Carotids are 2 + without bruits. No JVD   Lungs: Clear   Heart: RR, normal S1S2, no murmur  Abdomen: Soft, non-tender, non-distended with normal bowel  sounds.  Msk:  Strength and tone are normal   Extremities: No clubbing or cyanosis. No edema.  Distal pedal pulses are 2+ and equal    Neuro: CN II - XII intact.  Alert and oriented X 3.   Psych:  Normal   ECG: Oct. 4 , 2017:   NSR at 66.   Nonspecific ST abn No changes from previous ECGs  Assessment and Plan :    1. Sinus tachycardia - HR is well controlled ,  Tolerating the Toprol well.   continue same dose.  Will check TSH today  Will check screening Encouraged her to exercise as much as possible   2.  Migraine headaches (chronic )      Kristeen MissPhilip Nahser, MD  01/20/2016 6:06 PM    [facility] [address]  [address] [place], [place]  [address] Pager [phone] Phone: [phone]; Fax: [phone]        Kristeen MissPhilip Nahser, MD  01/20/2016 11:38 AM    [facility] [address]  [address] [place], [place]  [address] Pager [phone] Phone: [phone]; Fax: [phone]

## 2016-01-22 ENCOUNTER — Telehealth: Payer: Self-pay | Admitting: Nurse Practitioner

## 2016-01-22 DIAGNOSIS — E785 Hyperlipidemia, unspecified: Secondary | ICD-10-CM

## 2016-01-22 MED ORDER — ATORVASTATIN CALCIUM 20 MG PO TABS: 20.0000 mg | ORAL_TABLET | Freq: Every day | ORAL | 11 refills | Status: DC

## 2016-01-22 NOTE — Telephone Encounter (Signed)
-----   Message from Vesta MixerPhilip J Nahser, MD sent at 01/22/2016  5:01 PM EDT ----- Lipids are elevated.  I would start atrovastatin 20 mg a day  Recheck labs in 3 months  She should watch her intake of fats and carbs.    ----- Message ----- From: Levi AlandMichelle M Swinyer, RN Sent: 01/22/2016  12:18 PM To: Vesta MixerPhilip J Nahser, MD

## 2016-02-04 DIAGNOSIS — G43719 Chronic migraine without aura, intractable, without status migrainosus: Secondary | ICD-10-CM | POA: Diagnosis not present

## 2016-02-04 DIAGNOSIS — G243 Spasmodic torticollis: Secondary | ICD-10-CM | POA: Diagnosis not present

## 2016-02-10 DIAGNOSIS — J0100 Acute maxillary sinusitis, unspecified: Secondary | ICD-10-CM | POA: Diagnosis not present

## 2016-02-25 DIAGNOSIS — N390 Urinary tract infection, site not specified: Secondary | ICD-10-CM | POA: Diagnosis not present

## 2016-02-25 DIAGNOSIS — B373 Candidiasis of vulva and vagina: Secondary | ICD-10-CM | POA: Diagnosis not present

## 2016-02-25 DIAGNOSIS — R3915 Urgency of urination: Secondary | ICD-10-CM | POA: Diagnosis not present

## 2016-03-03 DIAGNOSIS — Z01411 Encounter for gynecological examination (general) (routine) with abnormal findings: Secondary | ICD-10-CM | POA: Diagnosis not present

## 2016-03-03 DIAGNOSIS — R300 Dysuria: Secondary | ICD-10-CM | POA: Diagnosis not present

## 2016-03-03 DIAGNOSIS — Z1151 Encounter for screening for human papillomavirus (HPV): Secondary | ICD-10-CM | POA: Diagnosis not present

## 2016-03-03 DIAGNOSIS — N952 Postmenopausal atrophic vaginitis: Secondary | ICD-10-CM | POA: Diagnosis not present

## 2016-03-03 DIAGNOSIS — R3915 Urgency of urination: Secondary | ICD-10-CM | POA: Diagnosis not present

## 2016-03-03 DIAGNOSIS — Z01419 Encounter for gynecological examination (general) (routine) without abnormal findings: Secondary | ICD-10-CM | POA: Diagnosis not present

## 2016-03-03 DIAGNOSIS — Z23 Encounter for immunization: Secondary | ICD-10-CM | POA: Diagnosis not present

## 2016-04-22 DIAGNOSIS — G43719 Chronic migraine without aura, intractable, without status migrainosus: Secondary | ICD-10-CM | POA: Diagnosis not present

## 2016-04-22 DIAGNOSIS — G243 Spasmodic torticollis: Secondary | ICD-10-CM | POA: Diagnosis not present

## 2016-04-26 ENCOUNTER — Other Ambulatory Visit: Payer: BLUE CROSS/BLUE SHIELD

## 2016-05-12 DIAGNOSIS — G43719 Chronic migraine without aura, intractable, without status migrainosus: Secondary | ICD-10-CM | POA: Diagnosis not present

## 2016-05-12 DIAGNOSIS — G243 Spasmodic torticollis: Secondary | ICD-10-CM | POA: Diagnosis not present

## 2016-06-21 DIAGNOSIS — L718 Other rosacea: Secondary | ICD-10-CM | POA: Diagnosis not present

## 2016-06-21 DIAGNOSIS — L218 Other seborrheic dermatitis: Secondary | ICD-10-CM | POA: Diagnosis not present

## 2016-06-23 ENCOUNTER — Encounter (HOSPITAL_COMMUNITY): Payer: Self-pay

## 2016-07-25 DIAGNOSIS — G243 Spasmodic torticollis: Secondary | ICD-10-CM | POA: Diagnosis not present

## 2016-07-25 DIAGNOSIS — G43719 Chronic migraine without aura, intractable, without status migrainosus: Secondary | ICD-10-CM | POA: Diagnosis not present

## 2016-08-04 DIAGNOSIS — G43719 Chronic migraine without aura, intractable, without status migrainosus: Secondary | ICD-10-CM | POA: Diagnosis not present

## 2016-08-04 DIAGNOSIS — G243 Spasmodic torticollis: Secondary | ICD-10-CM | POA: Diagnosis not present

## 2016-08-09 ENCOUNTER — Other Ambulatory Visit: Payer: BLUE CROSS/BLUE SHIELD | Admitting: *Deleted

## 2016-08-09 DIAGNOSIS — E785 Hyperlipidemia, unspecified: Secondary | ICD-10-CM | POA: Diagnosis not present

## 2016-08-09 LAB — LIPID PANEL
Chol/HDL Ratio: 2.5 ratio (ref 0.0–4.4)
Cholesterol, Total: 178 mg/dL (ref 100–199)
HDL: 70 mg/dL (ref >39)
LDL Calculated: 92 mg/dL (ref 0–99)
Triglycerides: 82 mg/dL (ref 0–149)
VLDL Cholesterol Cal: 16 mg/dL (ref 5–40)

## 2016-08-09 LAB — COMPREHENSIVE METABOLIC PANEL
ALT: 18 IU/L (ref 0–32)
AST: 19 IU/L (ref 0–40)
Albumin/Globulin Ratio: 2.1 (ref 1.2–2.2)
Albumin: 4.6 g/dL (ref 3.5–5.5)
Alkaline Phosphatase: 82 IU/L (ref 39–117)
BUN/Creatinine Ratio: 13 (ref 9–23)
BUN: 12 mg/dL (ref 6–24)
Bilirubin Total: 0.3 mg/dL (ref 0.0–1.2)
CO2: 20 mmol/L (ref 18–29)
Calcium: 9.8 mg/dL (ref 8.7–10.2)
Chloride: 108 mmol/L — ABNORMAL HIGH (ref 96–106)
Creatinine, Ser: 0.91 mg/dL (ref 0.57–1.00)
GFR calc Af Amer: 80 mL/min/{1.73_m2} (ref >59)
GFR calc non Af Amer: 69 mL/min/{1.73_m2} (ref >59)
Globulin, Total: 2.2 g/dL (ref 1.5–4.5)
Glucose: 84 mg/dL (ref 65–99)
Potassium: 4.1 mmol/L (ref 3.5–5.2)
Sodium: 146 mmol/L — ABNORMAL HIGH (ref 134–144)
Total Protein: 6.8 g/dL (ref 6.0–8.5)

## 2016-08-16 DIAGNOSIS — D225 Melanocytic nevi of trunk: Secondary | ICD-10-CM | POA: Diagnosis not present

## 2016-08-16 DIAGNOSIS — D2261 Melanocytic nevi of right upper limb, including shoulder: Secondary | ICD-10-CM | POA: Diagnosis not present

## 2016-08-16 DIAGNOSIS — L718 Other rosacea: Secondary | ICD-10-CM | POA: Diagnosis not present

## 2016-08-16 DIAGNOSIS — L821 Other seborrheic keratosis: Secondary | ICD-10-CM | POA: Diagnosis not present

## 2016-08-16 DIAGNOSIS — D485 Neoplasm of uncertain behavior of skin: Secondary | ICD-10-CM | POA: Diagnosis not present

## 2016-08-16 DIAGNOSIS — D2272 Melanocytic nevi of left lower limb, including hip: Secondary | ICD-10-CM | POA: Diagnosis not present

## 2016-10-21 DIAGNOSIS — G243 Spasmodic torticollis: Secondary | ICD-10-CM | POA: Diagnosis not present

## 2016-10-21 DIAGNOSIS — G43719 Chronic migraine without aura, intractable, without status migrainosus: Secondary | ICD-10-CM | POA: Diagnosis not present

## 2016-11-01 DIAGNOSIS — G243 Spasmodic torticollis: Secondary | ICD-10-CM | POA: Diagnosis not present

## 2016-11-01 DIAGNOSIS — G43719 Chronic migraine without aura, intractable, without status migrainosus: Secondary | ICD-10-CM | POA: Diagnosis not present

## 2016-11-03 DIAGNOSIS — G43919 Migraine, unspecified, intractable, without status migrainosus: Secondary | ICD-10-CM | POA: Diagnosis not present

## 2016-11-03 DIAGNOSIS — G8929 Other chronic pain: Secondary | ICD-10-CM | POA: Diagnosis not present

## 2016-11-03 DIAGNOSIS — R5382 Chronic fatigue, unspecified: Secondary | ICD-10-CM | POA: Diagnosis not present

## 2016-11-03 DIAGNOSIS — M791 Myalgia: Secondary | ICD-10-CM | POA: Diagnosis not present

## 2016-11-23 DIAGNOSIS — H5213 Myopia, bilateral: Secondary | ICD-10-CM | POA: Diagnosis not present

## 2016-11-27 ENCOUNTER — Other Ambulatory Visit: Payer: Self-pay | Admitting: Cardiovascular Disease

## 2016-12-21 DIAGNOSIS — M238X2 Other internal derangements of left knee: Secondary | ICD-10-CM | POA: Diagnosis not present

## 2016-12-21 DIAGNOSIS — Z4789 Encounter for other orthopedic aftercare: Secondary | ICD-10-CM | POA: Diagnosis not present

## 2016-12-21 DIAGNOSIS — M7051 Other bursitis of knee, right knee: Secondary | ICD-10-CM | POA: Diagnosis not present

## 2016-12-21 DIAGNOSIS — M25561 Pain in right knee: Secondary | ICD-10-CM | POA: Diagnosis not present

## 2016-12-27 DIAGNOSIS — M238X2 Other internal derangements of left knee: Secondary | ICD-10-CM | POA: Diagnosis not present

## 2017-01-20 ENCOUNTER — Other Ambulatory Visit: Payer: Self-pay | Admitting: Cardiovascular Disease

## 2017-01-20 DIAGNOSIS — G243 Spasmodic torticollis: Secondary | ICD-10-CM | POA: Diagnosis not present

## 2017-01-20 DIAGNOSIS — G43719 Chronic migraine without aura, intractable, without status migrainosus: Secondary | ICD-10-CM | POA: Diagnosis not present

## 2017-01-23 DIAGNOSIS — G8929 Other chronic pain: Secondary | ICD-10-CM | POA: Diagnosis not present

## 2017-01-23 DIAGNOSIS — M23322 Other meniscus derangements, posterior horn of medial meniscus, left knee: Secondary | ICD-10-CM | POA: Diagnosis not present

## 2017-01-23 DIAGNOSIS — S82132D Displaced fracture of medial condyle of left tibia, subsequent encounter for closed fracture with routine healing: Secondary | ICD-10-CM | POA: Diagnosis not present

## 2017-01-23 DIAGNOSIS — M25562 Pain in left knee: Secondary | ICD-10-CM | POA: Diagnosis not present

## 2017-02-18 ENCOUNTER — Other Ambulatory Visit: Payer: Self-pay | Admitting: Cardiovascular Disease

## 2017-02-20 DIAGNOSIS — S82132D Displaced fracture of medial condyle of left tibia, subsequent encounter for closed fracture with routine healing: Secondary | ICD-10-CM | POA: Diagnosis not present

## 2017-02-22 ENCOUNTER — Other Ambulatory Visit: Payer: Self-pay | Admitting: Cardiovascular Disease

## 2017-03-06 DIAGNOSIS — K589 Irritable bowel syndrome without diarrhea: Secondary | ICD-10-CM | POA: Diagnosis not present

## 2017-03-06 DIAGNOSIS — Z23 Encounter for immunization: Secondary | ICD-10-CM | POA: Diagnosis not present

## 2017-03-20 ENCOUNTER — Other Ambulatory Visit: Payer: Self-pay | Admitting: Cardiovascular Disease

## 2017-03-22 ENCOUNTER — Other Ambulatory Visit: Payer: Self-pay | Admitting: Cardiovascular Disease

## 2017-04-19 DIAGNOSIS — G243 Spasmodic torticollis: Secondary | ICD-10-CM | POA: Diagnosis not present

## 2017-04-19 DIAGNOSIS — G43719 Chronic migraine without aura, intractable, without status migrainosus: Secondary | ICD-10-CM | POA: Diagnosis not present

## 2017-04-19 DIAGNOSIS — R05 Cough: Secondary | ICD-10-CM | POA: Diagnosis not present

## 2017-05-02 ENCOUNTER — Other Ambulatory Visit: Payer: Self-pay | Admitting: Cardiovascular Disease

## 2017-05-16 ENCOUNTER — Other Ambulatory Visit: Payer: Self-pay | Admitting: Cardiovascular Disease

## 2017-06-27 ENCOUNTER — Other Ambulatory Visit: Payer: Self-pay | Admitting: Cardiovascular Disease

## 2017-06-28 DIAGNOSIS — R52 Pain, unspecified: Secondary | ICD-10-CM | POA: Diagnosis not present

## 2017-06-28 DIAGNOSIS — G43719 Chronic migraine without aura, intractable, without status migrainosus: Secondary | ICD-10-CM | POA: Diagnosis not present

## 2017-06-28 DIAGNOSIS — G243 Spasmodic torticollis: Secondary | ICD-10-CM | POA: Diagnosis not present

## 2017-06-30 ENCOUNTER — Encounter: Payer: Self-pay | Admitting: Cardiovascular Disease

## 2017-06-30 ENCOUNTER — Ambulatory Visit (INDEPENDENT_AMBULATORY_CARE_PROVIDER_SITE_OTHER): Payer: BLUE CROSS/BLUE SHIELD | Admitting: Cardiovascular Disease

## 2017-06-30 VITALS — BP 112/74 | HR 68 | Ht 65.0 in | Wt 136.0 lb

## 2017-06-30 DIAGNOSIS — R000 Tachycardia, unspecified: Secondary | ICD-10-CM | POA: Diagnosis not present

## 2017-06-30 DIAGNOSIS — E782 Mixed hyperlipidemia: Secondary | ICD-10-CM | POA: Diagnosis not present

## 2017-06-30 LAB — HEPATIC FUNCTION PANEL
ALT: 27 IU/L (ref 0–32)
AST: 25 IU/L (ref 0–40)
Albumin: 4.6 g/dL (ref 3.6–4.8)
Alkaline Phosphatase: 94 IU/L (ref 39–117)
Bilirubin Total: 0.4 mg/dL (ref 0.0–1.2)
Bilirubin, Direct: 0.14 mg/dL (ref 0.00–0.40)
Total Protein: 6.6 g/dL (ref 6.0–8.5)

## 2017-06-30 LAB — BASIC METABOLIC PANEL
BUN/Creatinine Ratio: 13 (ref 12–28)
BUN: 12 mg/dL (ref 8–27)
CO2: 22 mmol/L (ref 20–29)
Calcium: 10 mg/dL (ref 8.7–10.3)
Chloride: 107 mmol/L — ABNORMAL HIGH (ref 96–106)
Creatinine, Ser: 0.92 mg/dL (ref 0.57–1.00)
GFR calc Af Amer: 78 mL/min/{1.73_m2} (ref >59)
GFR calc non Af Amer: 68 mL/min/{1.73_m2} (ref >59)
Glucose: 81 mg/dL (ref 65–99)
Potassium: 4.3 mmol/L (ref 3.5–5.2)
Sodium: 142 mmol/L (ref 134–144)

## 2017-06-30 LAB — LIPID PANEL
Chol/HDL Ratio: 2.2 ratio (ref 0.0–4.4)
Cholesterol, Total: 172 mg/dL (ref 100–199)
HDL: 80 mg/dL (ref >39)
LDL Calculated: 81 mg/dL (ref 0–99)
Triglycerides: 53 mg/dL (ref 0–149)
VLDL Cholesterol Cal: 11 mg/dL (ref 5–40)

## 2017-06-30 MED ORDER — METOPROLOL SUCCINATE ER 25 MG PO TB24: 75.0000 mg | ORAL_TABLET | Freq: Every day | ORAL | 3 refills | Status: DC

## 2017-06-30 MED ORDER — ATORVASTATIN CALCIUM 20 MG PO TABS: 20.0000 mg | ORAL_TABLET | Freq: Every day | ORAL | 3 refills | Status: DC

## 2017-06-30 NOTE — Progress Notes (Signed)
Megan DaltonAnn P Holsomback Date of Birth  July 03, 1955       [facility]    [place] Office [address]  [address] [place], [place]  [address]   [place], [place]  [id number] [phone]     [phone]   Fax  [phone]     Fax [phone]  Problem List: 1. Sinus tachycardia 2.  Migraine headaches (chronic )  3.   :  Megan Michael is a 62 yo who is seen for tachycardia.  She has noticed her HR is high.  She has lots of fatigue.  Also has chronic migraines. She does not get any regular exercise.  Does walk the dogs.    She has not had any heat or cold intolerance.  No unexplained weight gain or weight loss. No chronic diarrhea.    Dec. 30, 2015:  Megan Michael is a 62 yo with chronic sinus tachycardia.  No changes in her HR. HR stays active.  Typically in the 90's to 100s with household chores.   HR increases to 120 on the treadmill.  October 23, 2014:    Megan Michael is doing ok.  Is seen back for her sinus tachycardia .  Exercises without problems  - occasionally brings on a migraine.   No cardiac concerns  Oct. 4, 2017:  Doing well [name] her left meniscus , eventually had surgery in Feb.  Doing better now.    June 30, 2017:    Megan Michael is  seen today for follow-up of her sinus tachycardia and hyperlipidemia. Exercising regularly  Had a left tibial plateau fracture.   Has healed  Walks regularly  No CP or dyspnea  Had pneumonia in Dec.   Is on albuterol   Current Outpatient Medications on File Prior to Visit  Medication Sig Dispense Refill  . atorvastatin (LIPITOR) 20 MG tablet Take 20 mg by mouth daily.    Marland Kitchen. buPROPion (WELLBUTRIN XL) 300 MG 24 hr tablet Take 300 mg by mouth daily.  5  . doxycycline (VIBRA-TABS) 100 MG tablet Take 100 mg by mouth every 30 (thirty) days. for 10 days  0  . fluticasone (FLONASE) 50 MCG/ACT nasal spray Place into both nostrils daily.    Marland Kitchen. gabapentin (NEURONTIN) 600 MG tablet Take 900 mg by mouth daily.  4  . metoprolol succinate  (TOPROL-XL) 25 MG 24 hr tablet Take 3 tablets (75 mg total) by mouth daily. Please keep upcoming appt for future refills. Thank you 90 tablet 1  . metoprolol succinate (TOPROL-XL) 25 MG 24 hr tablet Take 75 mg by mouth daily.    . Multiple Vitamins-Minerals (MULTIVITAMIN ADULT PO) Take 1 tablet by mouth daily.    . Olopatadine HCl 0.2 % SOLN Place 1 drop into both eyes daily.  5  . promethazine (PHENERGAN) 12.5 MG tablet Take 12.5 mg by mouth every 6 (six) hours as needed for nausea or vomiting.    . Riboflavin (VITAMIN B-2 PO) Take 400 mg by mouth daily.    . rizatriptan (MAXALT-MLT) 10 MG disintegrating tablet Take 10 mg by mouth daily as needed for migraine.     . TOPAMAX 100 MG tablet Take 100 mg by mouth daily.     . VENTOLIN HFA 108 (90 Base) MCG/ACT inhaler Inhale 2 puffs into the lungs every 6 (six) hours as needed for shortness of breath.  1   No current facility-administered medications on file prior to visit.     Allergies  Allergen Reactions  .  Penicillins     RASH AND TONGUE SWELLING    Past Medical History:  Diagnosis Date  . Migraines     Past Surgical History:  Procedure Laterality Date  . DISK REPLACEMENT  2010   CERVICAL SPINE     Social History   Tobacco Use  Smoking Status Never Smoker  Smokeless Tobacco Never Used    Social History   Substance and Sexual Activity  Alcohol Use No   Comment: "not really"    Family History  Problem Relation Age of Onset  . Hypertension Mother   . Breast cancer Mother 38       w/ recurrence and metastasis to intestines at 68  . Diabetes Mellitus I Father   . Heart attack Father        +EtOH  . Cancer - Prostate Father        dx. mid-late 24s  . Skin cancer Father        NOS type dx. later age  . Colon cancer Father        dx. after prostate cancer  . Diabetes Mellitus I Brother   . Prostate cancer Brother 64       unspecified Gleason score  . Bipolar disorder Maternal Uncle   . Stroke Maternal Grandfather    . Heart disease Maternal Grandfather   . Diabetes Mellitus I Brother   . Prostate cancer Brother 54       unspecified Gleason score; +surgery  . Other Brother        says he has prostate cancer, but pt not so sure    Reviw of Systems:  Reviewed in the HPI.  All other systems are negative.   ECG: Normal sinus rhythm at a heart rate of 68.  She has nonspecific ST abnormalities.  No changes from previous EKG.  Assessment and Plan :    1. Sinus tachycardia -well controlled on current dose of metoprolol succinate.  Continue current medications.  2.   Hyperlipidemia :   On atorvastatin .  Check labs today   Labs are been well controlled.     [name], MD  06/30/2017 10:10 AM    [facility] [address] [place],  [address] [place], [place]  [address] Pager [phone] Phone: [phone]; Fax: [phone]

## 2017-06-30 NOTE — Patient Instructions (Signed)

## 2017-07-31 DIAGNOSIS — L718 Other rosacea: Secondary | ICD-10-CM | POA: Diagnosis not present

## 2017-07-31 DIAGNOSIS — L738 Other specified follicular disorders: Secondary | ICD-10-CM | POA: Diagnosis not present

## 2017-07-31 DIAGNOSIS — L723 Sebaceous cyst: Secondary | ICD-10-CM | POA: Diagnosis not present

## 2017-07-31 DIAGNOSIS — L570 Actinic keratosis: Secondary | ICD-10-CM | POA: Diagnosis not present

## 2017-08-15 DIAGNOSIS — L573 Poikiloderma of Civatte: Secondary | ICD-10-CM | POA: Diagnosis not present

## 2017-08-15 DIAGNOSIS — D2261 Melanocytic nevi of right upper limb, including shoulder: Secondary | ICD-10-CM | POA: Diagnosis not present

## 2017-08-15 DIAGNOSIS — D485 Neoplasm of uncertain behavior of skin: Secondary | ICD-10-CM | POA: Diagnosis not present

## 2017-08-15 DIAGNOSIS — D225 Melanocytic nevi of trunk: Secondary | ICD-10-CM | POA: Diagnosis not present

## 2017-08-15 DIAGNOSIS — L718 Other rosacea: Secondary | ICD-10-CM | POA: Diagnosis not present

## 2017-09-25 DIAGNOSIS — G43719 Chronic migraine without aura, intractable, without status migrainosus: Secondary | ICD-10-CM | POA: Diagnosis not present

## 2017-09-25 DIAGNOSIS — G243 Spasmodic torticollis: Secondary | ICD-10-CM | POA: Diagnosis not present

## 2017-09-25 DIAGNOSIS — R140 Abdominal distension (gaseous): Secondary | ICD-10-CM | POA: Diagnosis not present

## 2017-09-25 DIAGNOSIS — K582 Mixed irritable bowel syndrome: Secondary | ICD-10-CM | POA: Diagnosis not present

## 2017-10-04 DIAGNOSIS — H9201 Otalgia, right ear: Secondary | ICD-10-CM | POA: Diagnosis not present

## 2017-10-09 DIAGNOSIS — G243 Spasmodic torticollis: Secondary | ICD-10-CM | POA: Diagnosis not present

## 2017-10-09 DIAGNOSIS — R52 Pain, unspecified: Secondary | ICD-10-CM | POA: Diagnosis not present

## 2017-10-09 DIAGNOSIS — G43719 Chronic migraine without aura, intractable, without status migrainosus: Secondary | ICD-10-CM | POA: Diagnosis not present

## 2017-10-10 DIAGNOSIS — Z1211 Encounter for screening for malignant neoplasm of colon: Secondary | ICD-10-CM | POA: Diagnosis not present

## 2017-10-10 DIAGNOSIS — Z800 Family history of malignant neoplasm of digestive organs: Secondary | ICD-10-CM | POA: Diagnosis not present

## 2017-10-10 DIAGNOSIS — Z8719 Personal history of other diseases of the digestive system: Secondary | ICD-10-CM | POA: Diagnosis not present

## 2017-10-12 DIAGNOSIS — Z800 Family history of malignant neoplasm of digestive organs: Secondary | ICD-10-CM | POA: Diagnosis not present

## 2017-10-12 DIAGNOSIS — Z8719 Personal history of other diseases of the digestive system: Secondary | ICD-10-CM | POA: Diagnosis not present

## 2017-10-30 DIAGNOSIS — L570 Actinic keratosis: Secondary | ICD-10-CM | POA: Diagnosis not present

## 2017-11-14 ENCOUNTER — Other Ambulatory Visit: Payer: Self-pay | Admitting: Cardiovascular Disease

## 2017-11-25 DIAGNOSIS — N390 Urinary tract infection, site not specified: Secondary | ICD-10-CM | POA: Diagnosis not present

## 2017-11-27 DIAGNOSIS — R300 Dysuria: Secondary | ICD-10-CM | POA: Diagnosis not present

## 2017-11-29 DIAGNOSIS — K582 Mixed irritable bowel syndrome: Secondary | ICD-10-CM | POA: Diagnosis not present

## 2017-12-28 DIAGNOSIS — Z6822 Body mass index (BMI) 22.0-22.9, adult: Secondary | ICD-10-CM | POA: Diagnosis not present

## 2017-12-28 DIAGNOSIS — Z01419 Encounter for gynecological examination (general) (routine) without abnormal findings: Secondary | ICD-10-CM | POA: Diagnosis not present

## 2018-01-01 DIAGNOSIS — G243 Spasmodic torticollis: Secondary | ICD-10-CM | POA: Diagnosis not present

## 2018-01-01 DIAGNOSIS — G43719 Chronic migraine without aura, intractable, without status migrainosus: Secondary | ICD-10-CM | POA: Diagnosis not present

## 2018-01-01 DIAGNOSIS — R52 Pain, unspecified: Secondary | ICD-10-CM | POA: Diagnosis not present

## 2018-01-10 DIAGNOSIS — H5213 Myopia, bilateral: Secondary | ICD-10-CM | POA: Diagnosis not present

## 2018-03-07 DIAGNOSIS — J4520 Mild intermittent asthma, uncomplicated: Secondary | ICD-10-CM | POA: Diagnosis not present

## 2018-03-07 DIAGNOSIS — Q676 Pectus excavatum: Secondary | ICD-10-CM | POA: Diagnosis not present

## 2018-03-07 DIAGNOSIS — E559 Vitamin D deficiency, unspecified: Secondary | ICD-10-CM | POA: Diagnosis not present

## 2018-03-07 DIAGNOSIS — R05 Cough: Secondary | ICD-10-CM | POA: Diagnosis not present

## 2018-04-04 DIAGNOSIS — G43719 Chronic migraine without aura, intractable, without status migrainosus: Secondary | ICD-10-CM | POA: Diagnosis not present

## 2018-04-04 DIAGNOSIS — R52 Pain, unspecified: Secondary | ICD-10-CM | POA: Diagnosis not present

## 2018-04-04 DIAGNOSIS — G243 Spasmodic torticollis: Secondary | ICD-10-CM | POA: Diagnosis not present

## 2018-07-02 ENCOUNTER — Encounter: Payer: Self-pay | Admitting: Physician Assistant

## 2018-07-02 ENCOUNTER — Other Ambulatory Visit: Payer: Self-pay

## 2018-07-02 ENCOUNTER — Ambulatory Visit (INDEPENDENT_AMBULATORY_CARE_PROVIDER_SITE_OTHER): Payer: BLUE CROSS/BLUE SHIELD | Admitting: Physician Assistant

## 2018-07-02 VITALS — BP 124/70 | HR 77 | Ht 64.5 in | Wt 132.1 lb

## 2018-07-02 DIAGNOSIS — E785 Hyperlipidemia, unspecified: Secondary | ICD-10-CM | POA: Insufficient documentation

## 2018-07-02 DIAGNOSIS — R000 Tachycardia, unspecified: Secondary | ICD-10-CM | POA: Diagnosis not present

## 2018-07-02 MED ORDER — ATORVASTATIN CALCIUM 20 MG PO TABS: 20.0000 mg | ORAL_TABLET | Freq: Every day | ORAL | 3 refills | Status: DC

## 2018-07-02 MED ORDER — METOPROLOL SUCCINATE ER 25 MG PO TB24: 75.0000 mg | ORAL_TABLET | Freq: Every day | ORAL | 3 refills | Status: DC

## 2018-07-02 NOTE — Progress Notes (Signed)
Cardiology Office Note:    Date:  07/02/2018   ID:  Megan Michael, DOB [birth year], MRN [id number]  PCP:  Megan Floro, MD  Cardiologist:  Megan Miss, MD   Electrophysiologist:  None   Referring MD: Megan Floro, MD   Chief Complaint  Patient presents with  . Follow-up    sinus tachycardia     History of Present Illness:    Megan Michael is a 63 y.o. female with sinus tachycardia and hyperlipidemia.  She is maintained on beta-blocker therapy.  She was last seen by Dr. Elease Michael in March 2019.   Megan Michael returns for follow-up.  She is here alone.  She has not had chest discomfort syncope, PND or leg swelling.  She has chronic shortness of breath related to asthma.  She has occasional palpitations.  However, her tachycardia is generally well controlled on beta-blocker therapy.  Prior CV studies:   The following studies were reviewed today:  Echocardiogram 12/19/2013 EF 55-60, normal wall motion, grade 1 diastolic dysfunction, moderate AI, mild MR    Past Medical History:  Diagnosis Date  . Asthma   . Hyperlipidemia   . Migraines   . Sinus tachycardia    Surgical Hx: The patient  has a past surgical history that includes DISK REPLACEMENT (2010).   Current Medications: Current Meds  Medication Sig  . AIMOVIG 140 MG/ML SOAJ Inject 1 Dose into the skin every 30 (thirty) days.  Marland Kitchen buPROPion (WELLBUTRIN XL) 150 MG 24 hr tablet Take 3 tablets by mouth daily.  . fluticasone (FLONASE) 50 MCG/ACT nasal spray Place into both nostrils daily.  Marland Kitchen gabapentin (NEURONTIN) 600 MG tablet Take 900 mg by mouth daily.  . Multiple Vitamins-Minerals (MULTIVITAMIN ADULT PO) Take 1 tablet by mouth daily.  . Olopatadine HCl 0.2 % SOLN Place 1 drop into both eyes daily.  . promethazine (PHENERGAN) 12.5 MG tablet Take 12.5 mg by mouth every 6 (six) hours as needed for nausea or vomiting.  . rizatriptan (MAXALT-MLT) 10 MG disintegrating tablet Take 10 mg by mouth daily as needed for  migraine.   . TOPAMAX 100 MG tablet Take 100 mg by mouth daily.   . VENTOLIN HFA 108 (90 Base) MCG/ACT inhaler Inhale 2 puffs into the lungs every 6 (six) hours as needed for shortness of breath.  . [DISCONTINUED] buPROPion (WELLBUTRIN XL) 300 MG 24 hr tablet Take 300 mg by mouth daily.  Marland Kitchen atorvastatin (LIPITOR) 20 MG tablet Take 1 tablet (20 mg total) by mouth daily.  . metoprolol succinate (TOPROL-XL) 25 MG 24 hr tablet Take 3 tablets (75 mg total) by mouth daily.     Allergies:   Penicillins   Social History   Tobacco Use  . Smoking status: Never Smoker  . Smokeless tobacco: Never Used  Substance Use Topics  . Alcohol use: No    Comment: "not really"  . Drug use: Not on file     Family Hx: The patient's family history includes Bipolar disorder in her maternal uncle; Breast cancer (age of onset: 67) in her mother; Cancer - Prostate in her father; Colon cancer in her father; Diabetes Mellitus I in her brother, brother, and father; Heart attack in her father; Heart disease in her maternal grandfather; Hypertension in her mother; Other in her brother; Prostate cancer (age of onset: 14) in her brother; Prostate cancer (age of onset: 69) in her brother; Skin cancer in her father; Stroke in her maternal grandfather.  ROS:   Please see  the history of present illness.    ROS All other systems reviewed and are negative.   EKGs/Labs/Other Test Reviewed:    EKG:  EKG is  ordered today.  The ekg ordered today demonstrates normal sinus rhythm, heart rate 77, normal axis, interventricular conduction delay, nonspecific ST-T wave abnormality, QTC 439, no change from prior tracing  Recent Labs: No results found for requested labs within last 8760 hours.   Recent Lipid Panel Lab Results  Component Value Date/Time   CHOL 172 06/30/2017 10:35 AM   TRIG 53 06/30/2017 10:35 AM   HDL 80 06/30/2017 10:35 AM   CHOLHDL 2.2 06/30/2017 10:35 AM   CHOLHDL 3.5 01/20/2016 11:54 AM   LDLCALC 81  06/30/2017 10:35 AM    Physical Exam:    VS:  BP 124/70   Pulse 77   Ht 5' 4.5" (1.638 m)   Wt 132 lb 1.9 oz (59.9 kg)   SpO2 98%   BMI 22.33 kg/m     Wt Readings from Last 3 Encounters:  07/02/18 132 lb 1.9 oz (59.9 kg)  06/30/17 136 lb (61.7 kg)  01/20/16 126 lb 9.6 oz (57.4 kg)     Physical Exam  Constitutional: She is oriented to person, place, and time. She appears well-developed and well-nourished. No distress.  HENT:  Head: Normocephalic and atraumatic.  Neck: Neck supple. No JVD present.  Cardiovascular: Normal rate, regular rhythm, S1 normal, S2 normal and normal heart sounds.  No murmur heard. Pulmonary/Chest: Effort normal. She has no rales.  Abdominal: Soft. There is no hepatomegaly.  Musculoskeletal:        General: No edema.  Neurological: She is alert and oriented to person, place, and time.  Skin: Skin is warm and dry.    ASSESSMENT & PLAN:    Sinus tachycardia Controlled on current therapy.  Continue metoprolol succinate.  Follow-up 1 year.  Hyperlipidemia, unspecified hyperlipidemia type Continue statin therapy.  Arrange follow-up CMET, lipids in 3 months.   Dispo:  Return in about 1 year (around 07/02/2019) for Routine Follow Up, w/ Dr. Elease Michael.   Medication Adjustments/Labs and Tests Ordered: Current medicines are reviewed at length with the patient today.  Concerns regarding medicines are outlined above.  Tests Ordered: Orders Placed This Encounter  Procedures  . Comprehensive metabolic panel  . Lipid panel  . EKG 12-Lead   Medication Changes: Meds ordered this encounter  Medications  . atorvastatin (LIPITOR) 20 MG tablet    Sig: Take 1 tablet (20 mg total) by mouth daily.    Dispense:  90 tablet    Refill:  3    Order Specific Question:   Supervising Provider    Answer:   Megan Michael, Megan Michael [[id number]]  . metoprolol succinate (TOPROL-XL) 25 MG 24 hr tablet    Sig: Take 3 tablets (75 mg total) by mouth daily.    Dispense:  270 tablet     Refill:  3    Order Specific Question:   Supervising Provider    Answer:   Megan Michael [[id number]]    Signed, Tereso Newcomer, PA-C  07/02/2018 2:24 PM    [facility] [address] [place], [place], [place]  [address] Phone: [phone]; Fax: [phone]

## 2018-07-02 NOTE — Patient Instructions (Signed)
Medication Instructions:   Your physician recommends that you continue on your current medications as directed. Please refer to the Current Medication list given to you today.   If you need a refill on your cardiac medications before your next appointment, please call your pharmacy.   Lab work:  RETURN FOR LABS IN 3 MONTHS CMET/LIPIDS    If you have labs (blood work) drawn today and your tests are completely normal, you will receive your results only by: Marland Kitchen MyChart Message (if you have MyChart) OR . A paper copy in the mail If you have any lab test that is abnormal or we need to change your treatment, we will call you to review the results.  Testing/Procedures: NONE ORDERED  TODAY    Follow-Up: At [facility], you and your health needs are our priority.  As part of our continuing mission to provide you with exceptional heart care, we have created designated Provider Care Teams.  These Care Teams include your primary Cardiologist (physician) and Advanced Practice Providers (APPs -  Physician Assistants and Nurse Practitioners) who all work together to provide you with the care you need, when you need it. You will need a follow up appointment in:  1 years.  Please call our office 2 months in advance to schedule this appointment.  You may see [name], MD or one of the following Advanced Practice Providers on your designated Care Team: Tereso Newcomer, PA-C [place], [place] . Berton Bon, NP  Any Other Special Instructions Will Be Listed Below (If Applicable).

## 2018-07-09 DIAGNOSIS — G43719 Chronic migraine without aura, intractable, without status migrainosus: Secondary | ICD-10-CM | POA: Diagnosis not present

## 2018-07-09 DIAGNOSIS — G243 Spasmodic torticollis: Secondary | ICD-10-CM | POA: Diagnosis not present

## 2018-07-16 DIAGNOSIS — G43719 Chronic migraine without aura, intractable, without status migrainosus: Secondary | ICD-10-CM | POA: Diagnosis not present

## 2018-07-16 DIAGNOSIS — R52 Pain, unspecified: Secondary | ICD-10-CM | POA: Diagnosis not present

## 2018-07-16 DIAGNOSIS — G243 Spasmodic torticollis: Secondary | ICD-10-CM | POA: Diagnosis not present

## 2018-07-17 DIAGNOSIS — K5901 Slow transit constipation: Secondary | ICD-10-CM | POA: Diagnosis not present

## 2018-07-17 DIAGNOSIS — K589 Irritable bowel syndrome without diarrhea: Secondary | ICD-10-CM | POA: Diagnosis not present

## 2018-10-08 ENCOUNTER — Other Ambulatory Visit: Payer: BLUE CROSS/BLUE SHIELD

## 2018-10-08 DIAGNOSIS — D225 Melanocytic nevi of trunk: Secondary | ICD-10-CM | POA: Diagnosis not present

## 2018-10-08 DIAGNOSIS — L821 Other seborrheic keratosis: Secondary | ICD-10-CM | POA: Diagnosis not present

## 2018-10-08 DIAGNOSIS — G243 Spasmodic torticollis: Secondary | ICD-10-CM | POA: Diagnosis not present

## 2018-10-08 DIAGNOSIS — L570 Actinic keratosis: Secondary | ICD-10-CM | POA: Diagnosis not present

## 2018-10-08 DIAGNOSIS — D1801 Hemangioma of skin and subcutaneous tissue: Secondary | ICD-10-CM | POA: Diagnosis not present

## 2018-10-08 DIAGNOSIS — G43719 Chronic migraine without aura, intractable, without status migrainosus: Secondary | ICD-10-CM | POA: Diagnosis not present

## 2018-10-08 DIAGNOSIS — D2261 Melanocytic nevi of right upper limb, including shoulder: Secondary | ICD-10-CM | POA: Diagnosis not present

## 2018-10-11 DIAGNOSIS — G243 Spasmodic torticollis: Secondary | ICD-10-CM | POA: Diagnosis not present

## 2018-10-11 DIAGNOSIS — G43719 Chronic migraine without aura, intractable, without status migrainosus: Secondary | ICD-10-CM | POA: Diagnosis not present

## 2018-10-11 DIAGNOSIS — R52 Pain, unspecified: Secondary | ICD-10-CM | POA: Diagnosis not present

## 2018-10-22 DIAGNOSIS — L570 Actinic keratosis: Secondary | ICD-10-CM | POA: Diagnosis not present

## 2018-12-06 DIAGNOSIS — G43719 Chronic migraine without aura, intractable, without status migrainosus: Secondary | ICD-10-CM | POA: Diagnosis not present

## 2018-12-06 DIAGNOSIS — G243 Spasmodic torticollis: Secondary | ICD-10-CM | POA: Diagnosis not present

## 2019-01-01 DIAGNOSIS — G243 Spasmodic torticollis: Secondary | ICD-10-CM | POA: Diagnosis not present

## 2019-01-01 DIAGNOSIS — R52 Pain, unspecified: Secondary | ICD-10-CM | POA: Diagnosis not present

## 2019-01-01 DIAGNOSIS — G43719 Chronic migraine without aura, intractable, without status migrainosus: Secondary | ICD-10-CM | POA: Diagnosis not present

## 2019-02-11 DIAGNOSIS — Z01419 Encounter for gynecological examination (general) (routine) without abnormal findings: Secondary | ICD-10-CM | POA: Diagnosis not present

## 2019-02-11 DIAGNOSIS — Z6820 Body mass index (BMI) 20.0-20.9, adult: Secondary | ICD-10-CM | POA: Diagnosis not present

## 2019-02-11 DIAGNOSIS — Z1151 Encounter for screening for human papillomavirus (HPV): Secondary | ICD-10-CM | POA: Diagnosis not present

## 2019-03-08 ENCOUNTER — Other Ambulatory Visit: Payer: Self-pay

## 2019-03-08 DIAGNOSIS — Z20822 Contact with and (suspected) exposure to covid-19: Secondary | ICD-10-CM

## 2019-03-10 LAB — NOVEL CORONAVIRUS, NAA: SARS-CoV-2, NAA: NOT DETECTED

## 2019-03-26 DIAGNOSIS — G243 Spasmodic torticollis: Secondary | ICD-10-CM | POA: Diagnosis not present

## 2019-04-02 DIAGNOSIS — G243 Spasmodic torticollis: Secondary | ICD-10-CM | POA: Diagnosis not present

## 2019-04-02 DIAGNOSIS — G43719 Chronic migraine without aura, intractable, without status migrainosus: Secondary | ICD-10-CM | POA: Diagnosis not present

## 2019-04-02 DIAGNOSIS — R52 Pain, unspecified: Secondary | ICD-10-CM | POA: Diagnosis not present

## 2019-05-30 ENCOUNTER — Other Ambulatory Visit: Payer: Self-pay | Admitting: Physician Assistant

## 2019-06-17 DIAGNOSIS — G243 Spasmodic torticollis: Secondary | ICD-10-CM | POA: Diagnosis not present

## 2019-06-23 ENCOUNTER — Other Ambulatory Visit: Payer: Self-pay | Admitting: Physician Assistant

## 2019-06-25 DIAGNOSIS — G243 Spasmodic torticollis: Secondary | ICD-10-CM | POA: Diagnosis not present

## 2019-06-25 DIAGNOSIS — R52 Pain, unspecified: Secondary | ICD-10-CM | POA: Diagnosis not present

## 2019-06-25 DIAGNOSIS — G43719 Chronic migraine without aura, intractable, without status migrainosus: Secondary | ICD-10-CM | POA: Diagnosis not present

## 2019-07-09 ENCOUNTER — Encounter: Payer: Self-pay | Admitting: Cardiovascular Disease

## 2019-07-09 NOTE — Progress Notes (Signed)
Megan Michael Date of Birth  [birth year]       [place] [address], [place], [place]  [address]   [place], [place]  [address] [place]   Fax  [phone]     Fax [phone]  Problem List: 1. Sinus tachycardia 2.  Migraine headaches (chronic )  3.   :  Megan Michael is a 64 yo who is seen for tachycardia.  She has noticed her HR is high.  She has lots of fatigue.  Also has chronic migraines. She does not get any regular exercise.  Does walk the dogs.    She has not had any heat or cold intolerance.  No unexplained weight gain or weight loss. No chronic diarrhea.    Dec. 30, 2015:  Megan Michael is a 64 yo with chronic sinus tachycardia.  No changes in her HR. HR stays active.  Typically in the 90's to 100s with household chores.   HR increases to 120 on the treadmill.  October 23, 2014:    Megan Michael is doing ok.  Is seen back for her sinus tachycardia .  Exercises without problems  - occasionally brings on a migraine.   No cardiac concerns  Oct. 4, 2017:  Doing well Megan Michael her left meniscus , eventually had surgery in Feb.  Doing better now.    June 30, 2017:    Megan Michael is  seen today for follow-up of her sinus tachycardia and hyperlipidemia. Exercising regularly  Had a left tibial plateau fracture.   Has healed  Walks regularly  No CP or dyspnea  Had pneumonia in Dec.   Is on albuterol   July 10, 2019: Megan Michael is seen today for follow-up of her sinus tachycardia and hyperlipidemia.  I saw her several years ago. Exercises regularly .   No CP , no dyspnea.  Has some asthma  HR is slow today .  Will be reducing her dose of Toprol XL    Current Outpatient Medications on File Prior to Visit  Medication Sig Dispense Refill  . metoprolol succinate (TOPROL-XL) 25 MG 24 hr tablet TAKE 3 TABLETS BY MOUTH DAILY 90 tablet 0  . AIMOVIG 140 MG/ML SOAJ Inject 1 Dose into the skin every 30 (thirty) days.      Marland Kitchen atorvastatin (LIPITOR) 20 MG tablet TAKE 1 TABLET BY MOUTH EVERY DAY 90 tablet 0  . buPROPion (WELLBUTRIN XL) 150 MG 24 hr tablet Take 3 tablets by mouth daily.    . fluticasone (FLONASE) 50 MCG/ACT nasal spray Place into both nostrils daily.    Marland Kitchen gabapentin (NEURONTIN) 600 MG tablet Take 900 mg by mouth daily.  4  . Multiple Vitamins-Minerals (MULTIVITAMIN ADULT PO) Take 1 tablet by mouth daily.    . Olopatadine HCl 0.2 % SOLN Place 1 drop into both eyes daily.  5  . promethazine (PHENERGAN) 12.5 MG tablet Take 12.5 mg by mouth every 6 (six) hours as needed for nausea or vomiting.    . rizatriptan (MAXALT-MLT) 10 MG disintegrating tablet Take 10 mg by mouth daily as needed for migraine.     . TOPAMAX 100 MG tablet Take 100 mg by mouth daily.     . VENTOLIN HFA 108 (90 Base) MCG/ACT inhaler Inhale 2 puffs into the lungs every 6 (six) hours as needed for shortness of breath.  1   No current facility-administered medications on file prior  to visit.    Allergies  Allergen Reactions  . Penicillins     RASH AND TONGUE SWELLING    Past Medical History:  Diagnosis Date  . Asthma   . Hyperlipidemia   . Migraines   . Sinus tachycardia     Past Surgical History:  Procedure Laterality Date  . DISK REPLACEMENT  2010   CERVICAL SPINE     Social History   Tobacco Use  Smoking Status Never Smoker  Smokeless Tobacco Never Used    Social History   Substance and Sexual Activity  Alcohol Use No   Comment: "not really"    Family History  Problem Relation Age of Onset  . Hypertension Mother   . Breast cancer Mother 54       w/ recurrence and metastasis to intestines at 38  . Diabetes Mellitus I Father   . Heart attack Father        +EtOH  . Cancer - Prostate Father        dx. mid-late 89s  . Skin cancer Father        NOS type dx. later age  . Colon cancer Father        dx. after prostate cancer  . Diabetes Mellitus I Brother   . Prostate cancer Brother 64        unspecified Gleason score  . Bipolar disorder Maternal Uncle   . Stroke Maternal Grandfather   . Heart disease Maternal Grandfather   . Diabetes Mellitus I Brother   . Prostate cancer Brother 54       unspecified Gleason score; +surgery  . Other Brother        says he has prostate cancer, but pt not so sure    Reviw of Systems:  Reviewed in the HPI.  All other systems are negative.  Physical Exam: There were no vitals taken for this visit.  GEN:  Well nourished, well developed in no acute distress HEENT: Normal NECK: No JVD; No carotid bruits LYMPHATICS: No lymphadenopathy CARDIAC: RRR , no murmurs, rubs, gallops RESPIRATORY:  Clear to auscultation without rales, wheezing or rhonchi  ABDOMEN: Soft, non-tender, non-distended MUSCULOSKELETAL:  No edema; No deformity  SKIN: Warm and dry NEUROLOGIC:  Alert and oriented x 3  ECG: July 10, 2019: Sinus bradycardia 59 beats a minute.  No ST or T wave changes.  Assessment and Plan :    1. Sinus tachycardia -her sinus tachycardia has resolved on the Toprol and in fact her heart rate is a little slow.  We will reduce the Toprol-XL to 50 mg a day.  She will send [place] some heart rate and blood pressure readings in about a month and we will consider reducing her Toprol-XL down to 25 mg a day at that time. 2.   Hyperlipidemia :   Last lipids from March, 2019 look good.  Her LDL is 81.  Total cholesterol is 172.  She will continue to follow-up with her primary medical doctor.     Megan Miss, MD  07/09/2019 8:50 PM    [facility] [address] [place],  [address] [place], [place]  [address] Pager [phone] Phone: [phone]; Fax: [phone]

## 2019-07-10 ENCOUNTER — Encounter: Payer: Self-pay | Admitting: Cardiovascular Disease

## 2019-07-10 ENCOUNTER — Ambulatory Visit (INDEPENDENT_AMBULATORY_CARE_PROVIDER_SITE_OTHER): Payer: BC Managed Care – PPO | Admitting: Cardiovascular Disease

## 2019-07-10 ENCOUNTER — Other Ambulatory Visit: Payer: Self-pay

## 2019-07-10 VITALS — BP 108/78 | HR 60 | Ht 64.5 in | Wt 114.2 lb

## 2019-07-10 DIAGNOSIS — E785 Hyperlipidemia, unspecified: Secondary | ICD-10-CM | POA: Diagnosis not present

## 2019-07-10 DIAGNOSIS — R000 Tachycardia, unspecified: Secondary | ICD-10-CM | POA: Diagnosis not present

## 2019-07-10 MED ORDER — METOPROLOL SUCCINATE ER 25 MG PO TB24: 50.0000 mg | ORAL_TABLET | Freq: Every day | ORAL | 6 refills | Status: DC

## 2019-07-10 NOTE — Patient Instructions (Signed)
Medication Instructions:  Your physician has recommended you make the following change in your medication:  1.) decrease metoprolol succinate (Toprol XL) to 50 mg daily  *If you need a refill on your cardiac medications before your next appointment, please call your pharmacy*   Lab Work: none If you have labs (blood work) drawn today and your tests are completely normal, you will receive your results only by: Marland Kitchen MyChart Message (if you have MyChart) OR . A paper copy in the mail If you have any lab test that is abnormal or we need to change your treatment, we will call you to review the results.   Testing/Procedures: none   Follow-Up: At [facility], you and your health needs are our priority.  As part of our continuing mission to provide you with exceptional heart care, we have created designated Provider Care Teams.  These Care Teams include your primary Cardiologist (physician) and Advanced Practice Providers (APPs -  Physician Assistants and Nurse Practitioners) who all work together to provide you with the care you need, when you need it.  Your next appointment:   12 month(s)  The format for your next appointment:   In Person  Provider:   You may see [name], MD or one of the following Advanced Practice Providers on your designated Care Team:    Tereso Newcomer, PA-C  [place], [place]  Berton Bon, NP    Other Instructions Write in through MyChart portal with your blood pressure and heart rate readings in about a month.  The dose of Toprol XL may be reduced further.

## 2019-08-16 ENCOUNTER — Other Ambulatory Visit: Payer: Self-pay | Admitting: Family Medicine

## 2019-08-16 DIAGNOSIS — Z1231 Encounter for screening mammogram for malignant neoplasm of breast: Secondary | ICD-10-CM

## 2019-08-20 ENCOUNTER — Other Ambulatory Visit: Payer: Self-pay | Admitting: Obstetrics and Gynecology

## 2019-08-20 DIAGNOSIS — E2839 Other primary ovarian failure: Secondary | ICD-10-CM

## 2019-09-05 ENCOUNTER — Other Ambulatory Visit: Payer: Self-pay

## 2019-09-05 ENCOUNTER — Ambulatory Visit
Admission: RE | Admit: 2019-09-05 | Discharge: 2019-09-05 | Disposition: A | Discharge status: Home / Self Care | Payer: BC Managed Care – PPO | Source: Ambulatory Visit | Attending: Family Medicine | Admitting: Family Medicine

## 2019-09-05 DIAGNOSIS — Z1231 Encounter for screening mammogram for malignant neoplasm of breast: Secondary | ICD-10-CM

## 2019-09-18 ENCOUNTER — Other Ambulatory Visit: Payer: Self-pay | Admitting: Physician Assistant

## 2019-09-19 DIAGNOSIS — G243 Spasmodic torticollis: Secondary | ICD-10-CM | POA: Diagnosis not present

## 2019-09-24 DIAGNOSIS — G243 Spasmodic torticollis: Secondary | ICD-10-CM | POA: Diagnosis not present

## 2019-09-24 DIAGNOSIS — G43719 Chronic migraine without aura, intractable, without status migrainosus: Secondary | ICD-10-CM | POA: Diagnosis not present

## 2019-09-24 DIAGNOSIS — R52 Pain, unspecified: Secondary | ICD-10-CM | POA: Diagnosis not present

## 2019-10-07 DIAGNOSIS — H168 Other keratitis: Secondary | ICD-10-CM | POA: Diagnosis not present

## 2019-10-22 DIAGNOSIS — N3000 Acute cystitis without hematuria: Secondary | ICD-10-CM | POA: Diagnosis not present

## 2019-10-22 DIAGNOSIS — R3915 Urgency of urination: Secondary | ICD-10-CM | POA: Diagnosis not present

## 2019-10-22 DIAGNOSIS — S80861A Insect bite (nonvenomous), right lower leg, initial encounter: Secondary | ICD-10-CM | POA: Diagnosis not present

## 2019-10-22 DIAGNOSIS — W57XXXA Bitten or stung by nonvenomous insect and other nonvenomous arthropods, initial encounter: Secondary | ICD-10-CM | POA: Diagnosis not present

## 2019-10-31 ENCOUNTER — Other Ambulatory Visit: Payer: BC Managed Care – PPO

## 2019-11-21 DIAGNOSIS — M85852 Other specified disorders of bone density and structure, left thigh: Secondary | ICD-10-CM | POA: Diagnosis not present

## 2019-11-21 DIAGNOSIS — Z780 Asymptomatic menopausal state: Secondary | ICD-10-CM | POA: Diagnosis not present

## 2019-11-21 DIAGNOSIS — M85851 Other specified disorders of bone density and structure, right thigh: Secondary | ICD-10-CM | POA: Diagnosis not present

## 2019-11-21 DIAGNOSIS — M810 Age-related osteoporosis without current pathological fracture: Secondary | ICD-10-CM | POA: Diagnosis not present

## 2019-11-27 DIAGNOSIS — G243 Spasmodic torticollis: Secondary | ICD-10-CM | POA: Diagnosis not present

## 2019-12-17 DIAGNOSIS — Z1331 Encounter for screening for depression: Secondary | ICD-10-CM | POA: Diagnosis not present

## 2019-12-17 DIAGNOSIS — G43719 Chronic migraine without aura, intractable, without status migrainosus: Secondary | ICD-10-CM | POA: Diagnosis not present

## 2019-12-17 DIAGNOSIS — G243 Spasmodic torticollis: Secondary | ICD-10-CM | POA: Diagnosis not present

## 2019-12-30 DIAGNOSIS — M810 Age-related osteoporosis without current pathological fracture: Secondary | ICD-10-CM | POA: Diagnosis not present

## 2019-12-30 DIAGNOSIS — E559 Vitamin D deficiency, unspecified: Secondary | ICD-10-CM | POA: Diagnosis not present

## 2019-12-30 DIAGNOSIS — R5383 Other fatigue: Secondary | ICD-10-CM | POA: Diagnosis not present

## 2020-01-02 DIAGNOSIS — M545 Low back pain: Secondary | ICD-10-CM | POA: Diagnosis not present

## 2020-01-02 DIAGNOSIS — M810 Age-related osteoporosis without current pathological fracture: Secondary | ICD-10-CM | POA: Diagnosis not present

## 2020-01-02 DIAGNOSIS — M546 Pain in thoracic spine: Secondary | ICD-10-CM | POA: Diagnosis not present

## 2020-01-20 DIAGNOSIS — M810 Age-related osteoporosis without current pathological fracture: Secondary | ICD-10-CM | POA: Diagnosis not present

## 2020-01-20 DIAGNOSIS — E559 Vitamin D deficiency, unspecified: Secondary | ICD-10-CM | POA: Diagnosis not present

## 2020-02-19 ENCOUNTER — Ambulatory Visit
Admission: RE | Admit: 2020-02-19 | Discharge: 2020-02-19 | Disposition: A | Discharge status: Home / Self Care | Payer: BC Managed Care – PPO | Source: Ambulatory Visit | Attending: Gastroenterology | Admitting: Gastroenterology

## 2020-02-19 ENCOUNTER — Other Ambulatory Visit: Payer: Self-pay | Admitting: Gastroenterology

## 2020-02-19 DIAGNOSIS — K5904 Chronic idiopathic constipation: Secondary | ICD-10-CM

## 2020-02-20 ENCOUNTER — Other Ambulatory Visit: Payer: Self-pay

## 2020-02-20 ENCOUNTER — Ambulatory Visit
Admission: RE | Admit: 2020-02-20 | Discharge: 2020-02-20 | Disposition: A | Discharge status: Home / Self Care | Payer: BC Managed Care – PPO | Source: Ambulatory Visit | Attending: Gastroenterology | Admitting: Gastroenterology

## 2020-02-20 ENCOUNTER — Other Ambulatory Visit: Payer: Self-pay | Admitting: Gastroenterology

## 2020-02-20 DIAGNOSIS — K5904 Chronic idiopathic constipation: Secondary | ICD-10-CM

## 2020-02-21 ENCOUNTER — Ambulatory Visit
Admission: RE | Admit: 2020-02-21 | Discharge: 2020-02-21 | Disposition: A | Discharge status: Home / Self Care | Payer: BC Managed Care – PPO | Source: Ambulatory Visit | Attending: Gastroenterology | Admitting: Gastroenterology

## 2020-02-21 ENCOUNTER — Other Ambulatory Visit: Payer: Self-pay | Admitting: Gastroenterology

## 2020-02-21 DIAGNOSIS — K5904 Chronic idiopathic constipation: Secondary | ICD-10-CM

## 2020-02-21 DIAGNOSIS — K5900 Constipation, unspecified: Secondary | ICD-10-CM | POA: Diagnosis not present

## 2020-02-28 DIAGNOSIS — R0981 Nasal congestion: Secondary | ICD-10-CM | POA: Diagnosis not present

## 2020-02-28 DIAGNOSIS — R059 Cough, unspecified: Secondary | ICD-10-CM | POA: Diagnosis not present

## 2020-02-28 DIAGNOSIS — Z03818 Encounter for observation for suspected exposure to other biological agents ruled out: Secondary | ICD-10-CM | POA: Diagnosis not present

## 2020-03-09 DIAGNOSIS — G243 Spasmodic torticollis: Secondary | ICD-10-CM | POA: Diagnosis not present

## 2020-03-10 DIAGNOSIS — Z1331 Encounter for screening for depression: Secondary | ICD-10-CM | POA: Diagnosis not present

## 2020-03-10 DIAGNOSIS — G43719 Chronic migraine without aura, intractable, without status migrainosus: Secondary | ICD-10-CM | POA: Diagnosis not present

## 2020-03-10 DIAGNOSIS — G243 Spasmodic torticollis: Secondary | ICD-10-CM | POA: Diagnosis not present

## 2020-03-27 ENCOUNTER — Other Ambulatory Visit (HOSPITAL_COMMUNITY): Payer: Self-pay | Admitting: *Deleted

## 2020-03-27 DIAGNOSIS — K5901 Slow transit constipation: Secondary | ICD-10-CM | POA: Diagnosis not present

## 2020-03-27 DIAGNOSIS — K5904 Chronic idiopathic constipation: Secondary | ICD-10-CM | POA: Diagnosis not present

## 2020-03-27 DIAGNOSIS — R634 Abnormal weight loss: Secondary | ICD-10-CM | POA: Diagnosis not present

## 2020-03-27 DIAGNOSIS — R109 Unspecified abdominal pain: Secondary | ICD-10-CM | POA: Diagnosis not present

## 2020-03-30 ENCOUNTER — Ambulatory Visit (HOSPITAL_COMMUNITY)
Admission: RE | Admit: 2020-03-30 | Discharge: 2020-03-30 | Disposition: A | Discharge status: Home / Self Care | Payer: BC Managed Care – PPO | Source: Ambulatory Visit | Attending: Sports Medicine | Admitting: Sports Medicine

## 2020-03-30 ENCOUNTER — Other Ambulatory Visit: Payer: Self-pay

## 2020-03-30 DIAGNOSIS — M810 Age-related osteoporosis without current pathological fracture: Secondary | ICD-10-CM | POA: Insufficient documentation

## 2020-03-30 MED ORDER — ROMOSOZUMAB-AQQG 105 MG/1.17ML ~~LOC~~ SOSY
210.0000 mg | PREFILLED_SYRINGE | SUBCUTANEOUS | Status: DC
  Administered 2020-03-30: 210 mg via SUBCUTANEOUS
  Filled 2020-03-30: qty 2.4

## 2020-03-30 NOTE — Discharge Instructions (Signed)
Romosozumab injection °What is this medicine? °ROMOSOZUMAB (roe moe SOZ ue mab) increases bone formation. It is used to treat osteoporosis in women. °This medicine may be used for other purposes; ask your health care provider or pharmacist if you have questions. °COMMON BRAND NAME(S): EVENITY °What should I tell my health care provider before I take this medicine? °They need to know if you have any of these conditions: °· dental disease or wear dentures °· heart disease °· history of stroke °· kidney disease °· low levels of calcium in the blood °· an unusual or allergic reaction to romosozumab, other medicines, foods, dyes or preservatives °· pregnant or trying to get pregnant °· breast-feeding °How should I use this medicine? °This medicine is for injection under the skin. It is given by a healthcare professional in a hospital or clinic setting. °Talk to your pediatrician about the use of this medicine in children. Special care may be needed. °Overdosage: If you think you have taken too much of this medicine contact a poison control center or emergency room at once. °NOTE: This medicine is only for you. Do not share this medicine with others. °What if I miss a dose? °Keep appointments for follow-up doses. It is important not to miss your dose. Call your doctor or healthcare professional if you are unable to keep an appointment. °What may interact with this medicine? °Interactions are not expected. °This list may not describe all possible interactions. Give your health care provider a list of all the medicines, herbs, non-prescription drugs, or dietary supplements you use. Also tell them if you smoke, drink alcohol, or use illegal drugs. Some items may interact with your medicine. °What should I watch for while using this medicine? °Your condition will be monitored carefully while you are receiving this medicine. °You may need blood work done while you are taking this medicine. °Some people who take this medicine  have severe bone, joint, or muscle pain. This medicine may also increase your risk for jaw problems or a broken thigh bone. Tell your healthcare professional right away if you have severe pain in your jaw, bones, joints, or muscles. Tell your healthcare professional if you have any pain that does not go away or that gets worse. °You should make sure you get enough calcium and vitamin D while you are taking this medicine. Discuss the foods you eat and the vitamins you take with your healthcare professional. °Tell your dentist and dental surgeon that you are taking this medicine. You should not have major dental surgery while on this medicine. See your dentist to have a dental exam and fix any dental problems before starting this medicine. Take good care of your teeth while on this medicine. Make sure you see your dentist for regular follow-up appointments. °What side effects may I notice from receiving this medicine? °Side effects that you should report to your doctor or health care professional as soon as possible: °· allergic reactions like skin rash, itching or hives, swelling of the face, lips, or tongue °· bone pain °· chest pain or chest tightness °· jaw pain, especially after dental work °· signs and symptoms of a stroke like changes in vision; confusion; trouble speaking or understanding; severe headaches; sudden numbness or weakness of the face, arm or leg; trouble walking; dizziness; loss of balance or coordination °· signs and symptoms of low calcium like fast heartbeat; muscle cramps; muscle pain; pain, tingling, numbness in the hands or feet; seizures °Side effects that usually do not require   medical attention (report these to your doctor or health care professional if they continue or are bothersome): °· headache °· joint pain °· pain, redness, or irritation at site where injected °· pain, tingling, numbness in the hands or feet °· swelling of ankles, feet, hands °· trouble sleeping °This list may not  describe all possible side effects. Call your doctor for medical advice about side effects. You may report side effects to FDA at 1-800-FDA-1088. °Where should I keep my medicine? °This medicine is given in a hospital or clinic and will not be stored at home. °NOTE: This sheet is a summary. It may not cover all possible information. If you have questions about this medicine, talk to your doctor, pharmacist, or health care provider. °© 2020 Elsevier/Gold Standard (2017-07-27 01:15:32) ° °

## 2020-04-03 ENCOUNTER — Other Ambulatory Visit: Payer: Self-pay | Admitting: Physician Assistant

## 2020-04-03 DIAGNOSIS — R109 Unspecified abdominal pain: Secondary | ICD-10-CM

## 2020-04-13 ENCOUNTER — Other Ambulatory Visit: Payer: Self-pay | Admitting: Physician Assistant

## 2020-04-13 DIAGNOSIS — R109 Unspecified abdominal pain: Secondary | ICD-10-CM

## 2020-04-27 ENCOUNTER — Other Ambulatory Visit: Payer: Self-pay

## 2020-04-27 ENCOUNTER — Ambulatory Visit (HOSPITAL_COMMUNITY)
Admission: RE | Admit: 2020-04-27 | Discharge: 2020-04-27 | Disposition: A | Discharge status: Home / Self Care | Payer: BC Managed Care – PPO | Source: Ambulatory Visit | Attending: Sports Medicine | Admitting: Sports Medicine

## 2020-04-27 DIAGNOSIS — M810 Age-related osteoporosis without current pathological fracture: Secondary | ICD-10-CM | POA: Insufficient documentation

## 2020-04-27 MED ORDER — ROMOSOZUMAB-AQQG 105 MG/1.17ML ~~LOC~~ SOSY
210.0000 mg | PREFILLED_SYRINGE | SUBCUTANEOUS | Status: DC
  Administered 2020-04-27: 210 mg via SUBCUTANEOUS
  Filled 2020-04-27: qty 2.4

## 2020-05-19 DIAGNOSIS — H5213 Myopia, bilateral: Secondary | ICD-10-CM | POA: Diagnosis not present

## 2020-05-20 DIAGNOSIS — E559 Vitamin D deficiency, unspecified: Secondary | ICD-10-CM | POA: Diagnosis not present

## 2020-05-20 DIAGNOSIS — M810 Age-related osteoporosis without current pathological fracture: Secondary | ICD-10-CM | POA: Diagnosis not present

## 2020-05-25 ENCOUNTER — Ambulatory Visit (HOSPITAL_COMMUNITY)
Admission: RE | Admit: 2020-05-25 | Discharge: 2020-05-25 | Disposition: A | Discharge status: Home / Self Care | Payer: BC Managed Care – PPO | Source: Ambulatory Visit | Attending: Sports Medicine | Admitting: Sports Medicine

## 2020-05-25 ENCOUNTER — Other Ambulatory Visit: Payer: Self-pay

## 2020-05-25 DIAGNOSIS — M810 Age-related osteoporosis without current pathological fracture: Secondary | ICD-10-CM | POA: Diagnosis not present

## 2020-05-25 DIAGNOSIS — G243 Spasmodic torticollis: Secondary | ICD-10-CM | POA: Diagnosis not present

## 2020-05-25 MED ORDER — ROMOSOZUMAB-AQQG 105 MG/1.17ML ~~LOC~~ SOSY
210.0000 mg | PREFILLED_SYRINGE | SUBCUTANEOUS | Status: DC
  Administered 2020-05-25: 210 mg via SUBCUTANEOUS
  Filled 2020-05-25: qty 2.4

## 2020-06-08 DIAGNOSIS — L720 Epidermal cyst: Secondary | ICD-10-CM | POA: Diagnosis not present

## 2020-06-08 DIAGNOSIS — L814 Other melanin hyperpigmentation: Secondary | ICD-10-CM | POA: Diagnosis not present

## 2020-06-08 DIAGNOSIS — L245 Irritant contact dermatitis due to other chemical products: Secondary | ICD-10-CM | POA: Diagnosis not present

## 2020-06-08 DIAGNOSIS — L570 Actinic keratosis: Secondary | ICD-10-CM | POA: Diagnosis not present

## 2020-06-16 DIAGNOSIS — G243 Spasmodic torticollis: Secondary | ICD-10-CM | POA: Diagnosis not present

## 2020-06-16 DIAGNOSIS — R52 Pain, unspecified: Secondary | ICD-10-CM | POA: Diagnosis not present

## 2020-06-16 DIAGNOSIS — G43719 Chronic migraine without aura, intractable, without status migrainosus: Secondary | ICD-10-CM | POA: Diagnosis not present

## 2020-06-21 ENCOUNTER — Other Ambulatory Visit: Payer: Self-pay | Admitting: Cardiovascular Disease

## 2020-06-22 ENCOUNTER — Ambulatory Visit (HOSPITAL_COMMUNITY)
Admission: RE | Admit: 2020-06-22 | Discharge: 2020-06-22 | Disposition: A | Discharge status: Home / Self Care | Payer: BC Managed Care – PPO | Source: Ambulatory Visit | Attending: Sports Medicine | Admitting: Sports Medicine

## 2020-06-22 ENCOUNTER — Other Ambulatory Visit: Payer: Self-pay

## 2020-06-22 DIAGNOSIS — M810 Age-related osteoporosis without current pathological fracture: Secondary | ICD-10-CM | POA: Insufficient documentation

## 2020-06-22 MED ORDER — ROMOSOZUMAB-AQQG 105 MG/1.17ML ~~LOC~~ SOSY
210.0000 mg | PREFILLED_SYRINGE | SUBCUTANEOUS | Status: DC
  Administered 2020-06-22: 210 mg via SUBCUTANEOUS
  Filled 2020-06-22: qty 2.4

## 2020-07-20 ENCOUNTER — Encounter (HOSPITAL_COMMUNITY): Payer: BC Managed Care – PPO

## 2020-07-21 ENCOUNTER — Other Ambulatory Visit: Payer: Self-pay | Admitting: Cardiovascular Disease

## 2020-07-22 ENCOUNTER — Ambulatory Visit (HOSPITAL_COMMUNITY)
Admission: RE | Admit: 2020-07-22 | Discharge: 2020-07-22 | Disposition: A | Discharge status: Home / Self Care | Payer: BC Managed Care – PPO | Source: Ambulatory Visit | Attending: Sports Medicine | Admitting: Sports Medicine

## 2020-07-22 ENCOUNTER — Other Ambulatory Visit: Payer: Self-pay

## 2020-07-22 DIAGNOSIS — M810 Age-related osteoporosis without current pathological fracture: Secondary | ICD-10-CM | POA: Diagnosis not present

## 2020-07-22 MED ORDER — ROMOSOZUMAB-AQQG 105 MG/1.17ML ~~LOC~~ SOSY
210.0000 mg | PREFILLED_SYRINGE | SUBCUTANEOUS | Status: DC
  Administered 2020-07-22: 210 mg via SUBCUTANEOUS
  Filled 2020-07-22: qty 2.4

## 2020-08-11 ENCOUNTER — Ambulatory Visit: Payer: BC Managed Care – PPO | Admitting: Cardiovascular Disease

## 2020-08-12 DIAGNOSIS — G243 Spasmodic torticollis: Secondary | ICD-10-CM | POA: Diagnosis not present

## 2020-08-18 ENCOUNTER — Other Ambulatory Visit: Payer: Self-pay

## 2020-08-18 ENCOUNTER — Telehealth: Payer: Self-pay | Admitting: Cardiovascular Disease

## 2020-08-18 MED ORDER — METOPROLOL SUCCINATE ER 25 MG PO TB24: 2.0000 | ORAL_TABLET | Freq: Every day | ORAL | 0 refills | Status: DC

## 2020-08-18 NOTE — Telephone Encounter (Signed)
*  STAT* If patient is at the pharmacy, call can be transferred to refill team.   1. Which medications need to be refilled? (please list name of each medication and dose if known)  metoprolol succinate (TOPROL-XL) 25 MG 24 hr tablet  2. Which pharmacy/location (including street and city if local pharmacy) is medication to be sent to?  [id number] - [place], [place] - [address]  3. Do they need a 30 day or 90 day supply?  90 day supply

## 2020-08-19 ENCOUNTER — Other Ambulatory Visit: Payer: Self-pay

## 2020-08-19 ENCOUNTER — Ambulatory Visit: Payer: BC Managed Care – PPO | Admitting: Cardiovascular Disease

## 2020-08-19 ENCOUNTER — Ambulatory Visit (HOSPITAL_COMMUNITY)
Admission: RE | Admit: 2020-08-19 | Discharge: 2020-08-19 | Disposition: A | Discharge status: Home / Self Care | Payer: BC Managed Care – PPO | Source: Ambulatory Visit | Attending: Sports Medicine | Admitting: Sports Medicine

## 2020-08-19 DIAGNOSIS — M810 Age-related osteoporosis without current pathological fracture: Secondary | ICD-10-CM | POA: Diagnosis not present

## 2020-08-19 MED ORDER — ROMOSOZUMAB-AQQG 105 MG/1.17ML ~~LOC~~ SOSY
210.0000 mg | PREFILLED_SYRINGE | SUBCUTANEOUS | Status: DC
  Administered 2020-08-19: 210 mg via SUBCUTANEOUS
  Filled 2020-08-19: qty 2.4

## 2020-09-16 ENCOUNTER — Encounter (HOSPITAL_COMMUNITY): Payer: BC Managed Care – PPO

## 2020-09-21 ENCOUNTER — Encounter (HOSPITAL_COMMUNITY): Payer: BC Managed Care – PPO

## 2020-09-23 ENCOUNTER — Ambulatory Visit (INDEPENDENT_AMBULATORY_CARE_PROVIDER_SITE_OTHER): Payer: BC Managed Care – PPO | Admitting: Family Medicine

## 2020-09-23 ENCOUNTER — Other Ambulatory Visit: Payer: Self-pay

## 2020-09-23 DIAGNOSIS — M810 Age-related osteoporosis without current pathological fracture: Secondary | ICD-10-CM | POA: Diagnosis not present

## 2020-09-27 ENCOUNTER — Telehealth: Payer: Self-pay

## 2020-09-27 NOTE — Telephone Encounter (Signed)
Needs appeal for Evenity.

## 2020-10-06 DIAGNOSIS — G43719 Chronic migraine without aura, intractable, without status migrainosus: Secondary | ICD-10-CM | POA: Diagnosis not present

## 2020-10-06 DIAGNOSIS — G243 Spasmodic torticollis: Secondary | ICD-10-CM | POA: Diagnosis not present

## 2020-10-06 DIAGNOSIS — R52 Pain, unspecified: Secondary | ICD-10-CM | POA: Diagnosis not present

## 2020-10-09 NOTE — Telephone Encounter (Signed)
Babe Stepter (Key: [id number])  Your information has been submitted to [facility] [place]. [employer] [place] will review the request and notify you of the determination decision directly, typically within 72 hours of receiving all information.  You will also receive your request decision electronically. To check for an update later, open this request again from your dashboard.  If [employer] has not responded within the specified timeframe or if you have any questions about your PA submission, contact [employer] [place] directly at [phone].

## 2020-10-13 NOTE — Telephone Encounter (Signed)
Megan Michael (Key: [id number])  This request has received a Favorable outcome from [facility].  Please keep in mind this is not a guarantee of payment. Eligibility and Benefit determinations will be made at the time of service.  Please note any additional information provided by [facility] [place] at the bottom of the screen.  Megan Michael Key: [id number] help? Call us at [phone] Outcome Approvedon June 24 Effective from 10/09/2020 through 10/08/2021. Drug Evenity 105MG /1.17ML syringes Form Form (CB)

## 2020-10-13 NOTE — Telephone Encounter (Signed)
Pt ready for scheduling on or after 10/23/20  Out-of-pocket cost due at time of visit: $416.35  Primary: [employer] Evenity co-insurance: 20% $391.35) Admin fee co-insurance: 20% ($25)  Secondary: n/a [place] co-insurance:  Admin fee co-insurance:   Deductible: $1700 of $1700 met  Prior Auth: APPROVED PA#: [email or web address] KEY: [id number] Valid: 10/09/20-10/08/21  

## 2020-10-20 ENCOUNTER — Encounter (HOSPITAL_COMMUNITY): Payer: BC Managed Care – PPO

## 2020-10-22 ENCOUNTER — Ambulatory Visit: Payer: BC Managed Care – PPO

## 2020-10-22 ENCOUNTER — Encounter: Payer: Self-pay | Admitting: Cardiovascular Disease

## 2020-10-22 NOTE — Progress Notes (Signed)
Megan Michael Date of Birth  05/16/1955       [facility] Office [address]  [address] [place], [place]  [address]   [place], [place]  [address] [phone]     [phone]   Fax  [phone]     Fax [phone]  Problem List: 1. Sinus tachycardia 2.  Migraine headaches (chronic )  3.   :  Megan Michael is a 65 yo who is seen for tachycardia.  She has noticed her HR is high.  She has lots of fatigue.  Also has chronic migraines. She does not get any regular exercise.  Does walk the dogs.    She has not had any heat or cold intolerance.  No unexplained weight gain or weight loss. No chronic diarrhea.    Dec. 30, 2015:  Megan Michael is a 65 yo with chronic sinus tachycardia.  No changes in her HR. HR stays active.  Typically in the 90's to 100s with household chores.   HR increases to 120 on the treadmill.  October 23, 2014:    Megan Michael is doing ok.  Is seen back for her sinus tachycardia .  Exercises without problems  - occasionally brings on a migraine.   No cardiac concerns  Oct. 4, 2017:  Doing well [name] her left meniscus , eventually had surgery in Feb.  Doing better now.    June 30, 2017:    Megan Michael is  seen today for follow-up of her sinus tachycardia and hyperlipidemia. Exercising regularly  Had a left tibial plateau fracture.   Has healed  Walks regularly  No CP or dyspnea  Had pneumonia in Dec.   Is on albuterol   July 10, 2019: Megan Michael is seen today for follow-up of her sinus tachycardia and hyperlipidemia.  I saw her several years ago. Exercises regularly .   No CP , no dyspnea.  Has some asthma  HR is slow today .  Will be reducing her dose of Toprol XL    October 29, 2020: Megan Michael is seen today for follow-up of her sinus tachycardia and hyperlipidemia.  She has been well controlled on Toprol-XL.  She exercises regularly. No palpitations   Current Outpatient Medications on File Prior to Visit  Medication Sig Dispense  Refill   AIMOVIG 140 MG/ML SOAJ Inject 1 Dose into the skin every 30 (thirty) days.     atorvastatin (LIPITOR) 20 MG tablet TAKE 1 TABLET BY MOUTH EVERY DAY 90 tablet 2   Azelaic Acid 15 % cream Apply 1 application topically 2 (two) times daily.     buPROPion (WELLBUTRIN XL) 150 MG 24 hr tablet Take 3 tablets by mouth daily.     calcium-vitamin D (OSCAL WITH D) 500-200 MG-UNIT TABS tablet Take by mouth.     cyanocobalamin 1000 MCG tablet Take by mouth.     fluticasone (FLONASE) 50 MCG/ACT nasal spray Place into both nostrils daily.     fluticasone (FLONASE) 50 MCG/ACT nasal spray Place into the nose.     gabapentin (NEURONTIN) 600 MG tablet Take 900 mg by mouth daily.  4   methocarbamol (ROBAXIN) 500 MG tablet Take 500-1,000 mg by mouth 3 (three) times daily as needed.     metoprolol succinate (TOPROL-XL) 25 MG 24 hr tablet Take 2 tablets (50 mg total) by mouth daily. 180 tablet 0   Multiple Vitamins-Minerals (MULTIVITAMIN ADULT PO) Take 1 tablet by mouth daily.  Olopatadine HCl 0.2 % SOLN Place 1 drop into both eyes daily.  5   promethazine (PHENERGAN) 12.5 MG tablet Take 12.5 mg by mouth every 6 (six) hours as needed for nausea or vomiting.     rizatriptan (MAXALT-MLT) 10 MG disintegrating tablet Take 10 mg by mouth daily as needed for migraine.      TOPAMAX 100 MG tablet Take 100 mg by mouth daily.      VENTOLIN HFA 108 (90 Base) MCG/ACT inhaler Inhale 2 puffs into the lungs every 6 (six) hours as needed for shortness of breath.  1   No current facility-administered medications on file prior to visit.    Allergies  Allergen Reactions   Penicillins     RASH AND TONGUE SWELLING   Amoxicillin-Pot Clavulanate Rash    Past Medical History:  Diagnosis Date   Asthma    Hyperlipidemia    Migraines    Sinus tachycardia     Past Surgical History:  Procedure Laterality Date   DISK REPLACEMENT  2010   CERVICAL SPINE     Social History   Tobacco Use  Smoking Status Never   Smokeless Tobacco Never    Social History   Substance and Sexual Activity  Alcohol Use No   Comment: "not really"    Family History  Problem Relation Age of Onset   Hypertension Mother    Breast cancer Mother 21       w/ recurrence and metastasis to intestines at 58   Diabetes Mellitus I Father    Heart attack Father        +EtOH   Cancer - Prostate Father        dx. mid-late 14s   Skin cancer Father        NOS type dx. later age   Colon cancer Father        dx. after prostate cancer   Diabetes Mellitus I Brother    Prostate cancer Brother 63       unspecified Gleason score   Bipolar disorder Maternal Uncle    Stroke Maternal Grandfather    Heart disease Maternal Grandfather    Diabetes Mellitus I Brother    Prostate cancer Brother 54       unspecified Gleason score; +surgery   Other Brother        says he has prostate cancer, but pt not so sure    Reviw of Systems:  Reviewed in the HPI.  All other systems are negative.  Physical Exam: Blood pressure 108/60, pulse 66, height 5' 4.5" (1.638 m), weight 105 lb 9.6 oz (47.9 kg), SpO2 99 %.  GEN:  Well nourished, well developed in no acute distress HEENT: Normal NECK: No JVD; No carotid bruits LYMPHATICS: No lymphadenopathy CARDIAC: RRR , no murmurs, rubs, gallops RESPIRATORY:  Clear to auscultation without rales, wheezing or rhonchi  ABDOMEN: Soft, non-tender, non-distended MUSCULOSKELETAL:  No edema; No deformity  SKIN: Warm and dry NEUROLOGIC:  Alert and oriented x 3   ECG:   October 29, 2020:   NSR,  no St or T wave changes.    Assessment and Plan :    1. Sinus tachycardia - Well controlled on the toprol.  Cont.   2.   Hyperlipidemia :    Cont atorvastatin  Check lipids, bmp, ALT today     [name], MD  10/29/2020 11:40 AM    [facility] [address] [place],  [address] [place], [place]  [address]  Pager [phone] Phone: [phone]; Fax: [phone]

## 2020-10-26 ENCOUNTER — Other Ambulatory Visit: Payer: Self-pay

## 2020-10-26 ENCOUNTER — Ambulatory Visit (INDEPENDENT_AMBULATORY_CARE_PROVIDER_SITE_OTHER): Payer: BC Managed Care – PPO | Admitting: *Deleted

## 2020-10-26 DIAGNOSIS — M810 Age-related osteoporosis without current pathological fracture: Secondary | ICD-10-CM | POA: Diagnosis not present

## 2020-10-26 NOTE — Progress Notes (Signed)
Patient here for nurse visit for Evenity. Patient received both SQ injections in her left arm. Patient tolerated injections well. She will return in 1 month for her next injection.

## 2020-10-29 ENCOUNTER — Encounter: Payer: Self-pay | Admitting: Cardiovascular Disease

## 2020-10-29 ENCOUNTER — Other Ambulatory Visit: Payer: Self-pay

## 2020-10-29 ENCOUNTER — Ambulatory Visit (INDEPENDENT_AMBULATORY_CARE_PROVIDER_SITE_OTHER): Payer: BC Managed Care – PPO | Admitting: Cardiovascular Disease

## 2020-10-29 VITALS — BP 108/60 | HR 66 | Ht 64.5 in | Wt 105.6 lb

## 2020-10-29 DIAGNOSIS — E785 Hyperlipidemia, unspecified: Secondary | ICD-10-CM | POA: Diagnosis not present

## 2020-10-29 DIAGNOSIS — R000 Tachycardia, unspecified: Secondary | ICD-10-CM

## 2020-10-29 LAB — HEPATIC FUNCTION PANEL
ALT: 22 IU/L (ref 0–32)
AST: 26 IU/L (ref 0–40)
Albumin: 4.5 g/dL (ref 3.8–4.8)
Alkaline Phosphatase: 95 IU/L (ref 44–121)
Bilirubin Total: 0.3 mg/dL (ref 0.0–1.2)
Bilirubin, Direct: 0.12 mg/dL (ref 0.00–0.40)
Total Protein: 6.4 g/dL (ref 6.0–8.5)

## 2020-10-29 LAB — BASIC METABOLIC PANEL
BUN/Creatinine Ratio: 17 (ref 12–28)
BUN: 13 mg/dL (ref 8–27)
CO2: 24 mmol/L (ref 20–29)
Calcium: 9.3 mg/dL (ref 8.7–10.3)
Chloride: 105 mmol/L (ref 96–106)
Creatinine, Ser: 0.78 mg/dL (ref 0.57–1.00)
Glucose: 87 mg/dL (ref 65–99)
Potassium: 4.3 mmol/L (ref 3.5–5.2)
Sodium: 142 mmol/L (ref 134–144)
eGFR: 85 mL/min/{1.73_m2} (ref >59)

## 2020-10-29 LAB — LIPID PANEL
Chol/HDL Ratio: 2.1 ratio (ref 0.0–4.4)
Cholesterol, Total: 156 mg/dL (ref 100–199)
HDL: 75 mg/dL (ref >39)
LDL Chol Calc (NIH): 65 mg/dL (ref 0–99)
Triglycerides: 88 mg/dL (ref 0–149)
VLDL Cholesterol Cal: 16 mg/dL (ref 5–40)

## 2020-10-29 NOTE — Patient Instructions (Signed)
Medication Instructions:  Your physician recommends that you continue on your current medications as directed. Please refer to the Current Medication list given to you today.  *If you need a refill on your cardiac medications before your next appointment, please call your pharmacy*   Lab Work: TODAY - cholesterol, liver panel, bmet If you have labs (blood work) drawn today and your tests are completely normal, you will receive your results only by: MyChart Message (if you have MyChart) OR A paper copy in the mail If you have any lab test that is abnormal or we need to change your treatment, we will call you to review the results.   Testing/Procedures: None Ordered   Follow-Up: At [facility], you and your health needs are our priority.  As part of our continuing mission to provide you with exceptional heart care, we have created designated Provider Care Teams.  These Care Teams include your primary Cardiologist (physician) and Advanced Practice Providers (APPs -  Physician Assistants and Nurse Practitioners) who all work together to provide you with the care you need, when you need it.   Your next appointment:   1 year(s)  The format for your next appointment:   In Person  Provider:   You may see [name], MD or one of the following Advanced Practice Providers on your designated Care Team:   Tereso Newcomer, PA-C Vin Harvey, [place]

## 2020-10-30 ENCOUNTER — Telehealth: Payer: Self-pay | Admitting: Cardiovascular Disease

## 2020-10-30 ENCOUNTER — Ambulatory Visit: Payer: BC Managed Care – PPO | Admitting: Cardiovascular Disease

## 2020-10-30 NOTE — Telephone Encounter (Signed)
-----   Message from Vesta Mixer, MD sent at 10/30/2020  3:04 PM EDT ----- Lipid levels look great  BMP and liver enz are normal

## 2020-10-30 NOTE — Telephone Encounter (Signed)
Pt is returning a call about results  

## 2020-10-30 NOTE — Telephone Encounter (Signed)
The patient has been notified of the result and verbalized understanding.  All questions (if any) were answered. Loa Socks, LPN 9/37/1696 7:89 PM

## 2020-10-31 NOTE — Telephone Encounter (Signed)
Pt received Evenity inj 10/26/20 Next inj due 11/27/20  Inj scheduled 11/30/20

## 2020-11-16 ENCOUNTER — Other Ambulatory Visit: Payer: Self-pay | Admitting: Cardiovascular Disease

## 2020-11-30 ENCOUNTER — Ambulatory Visit (INDEPENDENT_AMBULATORY_CARE_PROVIDER_SITE_OTHER): Payer: BC Managed Care – PPO | Admitting: *Deleted

## 2020-11-30 ENCOUNTER — Other Ambulatory Visit: Payer: Self-pay

## 2020-11-30 DIAGNOSIS — M810 Age-related osteoporosis without current pathological fracture: Secondary | ICD-10-CM

## 2020-11-30 NOTE — Progress Notes (Signed)
Patient is here for nurse visit for Evenity. Patient received both SQ injections in her right arm. Patient tolerated injections well. She will return next month for her next Evenity inj.

## 2020-12-01 NOTE — Telephone Encounter (Signed)
Evenity inj #9 given 11/30/20 Next inj due 01/01/21  Inj scheduled for 01/06/21

## 2020-12-02 DIAGNOSIS — G243 Spasmodic torticollis: Secondary | ICD-10-CM | POA: Diagnosis not present

## 2020-12-15 DIAGNOSIS — G243 Spasmodic torticollis: Secondary | ICD-10-CM | POA: Diagnosis not present

## 2020-12-15 DIAGNOSIS — R52 Pain, unspecified: Secondary | ICD-10-CM | POA: Diagnosis not present

## 2020-12-15 DIAGNOSIS — G43719 Chronic migraine without aura, intractable, without status migrainosus: Secondary | ICD-10-CM | POA: Diagnosis not present

## 2020-12-22 DIAGNOSIS — J019 Acute sinusitis, unspecified: Secondary | ICD-10-CM | POA: Diagnosis not present

## 2020-12-22 DIAGNOSIS — M7701 Medial epicondylitis, right elbow: Secondary | ICD-10-CM | POA: Diagnosis not present

## 2020-12-22 DIAGNOSIS — J209 Acute bronchitis, unspecified: Secondary | ICD-10-CM | POA: Diagnosis not present

## 2020-12-22 DIAGNOSIS — M25521 Pain in right elbow: Secondary | ICD-10-CM | POA: Diagnosis not present

## 2021-01-06 ENCOUNTER — Other Ambulatory Visit: Payer: Self-pay

## 2021-01-06 ENCOUNTER — Ambulatory Visit (INDEPENDENT_AMBULATORY_CARE_PROVIDER_SITE_OTHER): Payer: BC Managed Care – PPO | Admitting: *Deleted

## 2021-01-06 DIAGNOSIS — M810 Age-related osteoporosis without current pathological fracture: Secondary | ICD-10-CM | POA: Diagnosis not present

## 2021-01-06 NOTE — Progress Notes (Signed)
Patient is here for nurse visit for Evenity. Patient received both SQ injections in her left arm. Patient tolerated injections well. She will return next month for her next Evenity inj.

## 2021-01-07 ENCOUNTER — Other Ambulatory Visit: Payer: Self-pay | Admitting: Family Medicine

## 2021-01-07 ENCOUNTER — Ambulatory Visit
Admission: RE | Admit: 2021-01-07 | Discharge: 2021-01-07 | Disposition: A | Discharge status: Home / Self Care | Payer: BC Managed Care – PPO | Source: Ambulatory Visit | Attending: Family Medicine | Admitting: Family Medicine

## 2021-01-07 DIAGNOSIS — J849 Interstitial pulmonary disease, unspecified: Secondary | ICD-10-CM | POA: Diagnosis not present

## 2021-01-07 DIAGNOSIS — R059 Cough, unspecified: Secondary | ICD-10-CM | POA: Diagnosis not present

## 2021-01-12 ENCOUNTER — Other Ambulatory Visit: Payer: Self-pay | Admitting: Family Medicine

## 2021-01-12 DIAGNOSIS — J189 Pneumonia, unspecified organism: Secondary | ICD-10-CM

## 2021-01-12 NOTE — Telephone Encounter (Addendum)
Evenity Injection Schedule Inj #1  Inj #2  Inj #3  Inj #4  Inj #5  Inj #6  Inj #7  Inj #8 - 10/26/20 Inj #9 - 11/30/20 Inj #10 - 01/06/21 Inj #11 - 02/08/21 Inj #12 - 03/16/21

## 2021-01-14 ENCOUNTER — Ambulatory Visit
Admission: RE | Admit: 2021-01-14 | Discharge: 2021-01-14 | Disposition: A | Discharge status: Home / Self Care | Payer: BC Managed Care – PPO | Source: Ambulatory Visit | Attending: Family Medicine | Admitting: Family Medicine

## 2021-01-14 DIAGNOSIS — R911 Solitary pulmonary nodule: Secondary | ICD-10-CM | POA: Diagnosis not present

## 2021-01-14 DIAGNOSIS — J479 Bronchiectasis, uncomplicated: Secondary | ICD-10-CM | POA: Diagnosis not present

## 2021-01-14 DIAGNOSIS — R0602 Shortness of breath: Secondary | ICD-10-CM | POA: Diagnosis not present

## 2021-01-14 DIAGNOSIS — J189 Pneumonia, unspecified organism: Secondary | ICD-10-CM

## 2021-01-20 NOTE — Progress Notes (Signed)
Synopsis: Referred for atypical pneumonia by Lawerance Cruel, MD  Subjective:   PATIENT ID: Megan Michael GENDER: female DOB: [birth year], MRN: [id number]  Chief Complaint  Patient presents with   Consult    Patient reports that she is still having some cough, congestion and PNA, She had a CT.    65yF with history of asthma, c-spine disc replacement in 2010 who is referred for atypical pneumonia by PCP.  She says she has 'never had good lungs.' Was told she had issues when she was in her 24s. Has had pneumonia before, never hospitalized. Frequently gets a cough that lingers after URIs. Had covid in May, took paxlovid. She was fully vaccinated for covid.   She does have a productive cough but it takes some work to expectorate. No hemoptysis. Minor weight loss unintentionally (6 lb since she had covid). No fever, night sweats.  No frank aspiration that she recalls unless she's gotten botox for migraines.   She has son that has frequently had lung issues. Says he gets stridor frequently. Has asthma. No other lung issues in family.  Otherwise pertinent review of systems is negative.  Never smoker, vaping. She was a Print production planner. She lived in [place] for 4.5 years in [place], [place]. She has 5 cats, 3 dogs. She did have a pet bird 15 years ago, parakeets. She does not have a hot tub.   Past Medical History:  Diagnosis Date   Asthma    Hyperlipidemia    Migraines    Sinus tachycardia      Family History  Problem Relation Age of Onset   Hypertension Mother    Breast cancer Mother 37       w/ recurrence and metastasis to intestines at 58   Diabetes Mellitus I Father    Heart attack Father        +EtOH   Cancer - Prostate Father        dx. mid-late 72s   Skin cancer Father        NOS type dx. later age   Colon cancer Father        dx. after prostate cancer   Diabetes Mellitus I Brother    Prostate cancer Brother 43       unspecified Gleason score   Bipolar disorder Maternal  Uncle    Stroke Maternal Grandfather    Heart disease Maternal Grandfather    Diabetes Mellitus I Brother    Prostate cancer Brother 64       unspecified Gleason score; +surgery   Other Brother        says he has prostate cancer, but pt not so sure     Past Surgical History:  Procedure Laterality Date   DISK REPLACEMENT  2010   CERVICAL SPINE     Social History   Socioeconomic History   Marital status: Married    Spouse name: Not on file   Number of children: Not on file   Years of education: Not on file   Highest education level: Not on file  Occupational History   Not on file  Tobacco Use   Smoking status: Never   Smokeless tobacco: Never  Substance and Sexual Activity   Alcohol use: No    Comment: "not really"   Drug use: Not on file   Sexual activity: Not on file  Other Topics Concern   Not on file  Social History Narrative   Not on file   Social Determinants of  Health   Financial Resource Strain: Not on file  Food Insecurity: Not on file  Transportation Needs: Not on file  Physical Activity: Not on file  Stress: Not on file  Social Connections: Not on file  Intimate Partner Violence: Not on file     Allergies  Allergen Reactions   Penicillins Swelling    RASH AND TONGUE SWELLING   Amoxicillin-Pot Clavulanate Rash    "Patient reports it makes her break out".     Outpatient Medications Prior to Visit  Medication Sig Dispense Refill   AIMOVIG 140 MG/ML SOAJ Inject 1 Dose into the skin every 30 (thirty) days.     atorvastatin (LIPITOR) 20 MG tablet TAKE 1 TABLET BY MOUTH EVERY DAY 90 tablet 2   Azelaic Acid 15 % cream Apply 1 application topically 2 (two) times daily.     buPROPion (WELLBUTRIN XL) 150 MG 24 hr tablet Take 3 tablets by mouth daily.     calcium-vitamin D (OSCAL WITH D) 500-200 MG-UNIT TABS tablet Take by mouth.     cyanocobalamin 1000 MCG tablet Take by mouth.     fluticasone (FLONASE) 50 MCG/ACT nasal spray Place into the nose.      gabapentin (NEURONTIN) 600 MG tablet Take 900 mg by mouth daily.  4   methocarbamol (ROBAXIN) 500 MG tablet Take 500-1,000 mg by mouth 3 (three) times daily as needed.     metoprolol succinate (TOPROL-XL) 25 MG 24 hr tablet TAKE 2 TABLETS BY MOUTH EVERY DAY 180 tablet 0   Multiple Vitamins-Minerals (MULTIVITAMIN ADULT PO) Take 1 tablet by mouth daily.     Olopatadine HCl 0.2 % SOLN Place 1 drop into both eyes daily.  5   promethazine (PHENERGAN) 12.5 MG tablet Take 12.5 mg by mouth every 6 (six) hours as needed for nausea or vomiting.     rizatriptan (MAXALT-MLT) 10 MG disintegrating tablet Take 10 mg by mouth daily as needed for migraine.      TOPAMAX 100 MG tablet Take 100 mg by mouth daily.      VENTOLIN HFA 108 (90 Base) MCG/ACT inhaler Inhale 2 puffs into the lungs every 6 (six) hours as needed for shortness of breath.  1   predniSONE (STERAPRED UNI-PAK 21 TAB) 10 MG (21) TBPK tablet TAKE 6 TABLETS ON DAY 1 AS DIRECTED ON PACKAGE AND DECREASE BY 1 TAB EACH DAY FOR A TOTAL OF 6 DAYS     azithromycin (ZITHROMAX) 250 MG tablet TAKE 2 TABLETS BY MOUTH TODAY, THEN TAKE 1 TABLET DAILY FOR 4 DAYS     clarithromycin (BIAXIN) 500 MG tablet SMARTSIG:1 Tablet(s) By Mouth Every 12 Hours     doxycycline (VIBRAMYCIN) 100 MG capsule Take 100 mg by mouth 2 (two) times daily.     fluticasone (FLONASE) 50 MCG/ACT nasal spray Place into both nostrils daily.     No facility-administered medications prior to visit.       Objective:   Physical Exam:  General appearance: 65 y.o., female, NAD, conversant  Eyes: anicteric sclerae, moist conjunctivae; no lid-lag; PERRL, tracking appropriately HENT: NCAT; oropharynx, MMM, no mucosal ulcerations; normal hard and soft palate Neck: Trachea midline; no lymphadenopathy, no JVD Lungs: CTAB, no crackles, no wheeze, with normal respiratory effort CV: RRR, no MRGs  Abdomen: Soft, non-tender; non-distended, BS present  Extremities: No peripheral edema, radial and DP  pulses present bilaterally  Skin: Normal temperature, turgor and texture; no rash Psych: Appropriate affect Neuro: Alert and oriented to person and place, no focal deficit  Vitals:   01/21/21 0912  BP: 100/72  Pulse: 66  Temp: 97.6 F (36.4 C)  TempSrc: Oral  SpO2: 100%  Weight: 100 lb 12.8 oz (45.7 kg)  Height: 5' 4.5" (1.638 m)   100% on RA BMI Readings from Last 3 Encounters:  01/21/21 17.04 kg/m  01/06/21 17.74 kg/m  10/29/20 17.85 kg/m   Wt Readings from Last 3 Encounters:  01/21/21 100 lb 12.8 oz (45.7 kg)  01/06/21 105 lb (47.6 kg)  10/29/20 105 lb 9.6 oz (47.9 kg)     CBC    Component Value Date/Time   WBC 8.5 12/09/2013 1647   RBC 4.06 12/09/2013 1647   HGB 13.0 12/09/2013 1647   HCT 39.0 12/09/2013 1647   PLT 310.0 12/09/2013 1647   MCV 96.0 12/09/2013 1647   MCHC 33.3 12/09/2013 1647   RDW 13.0 12/09/2013 1647   LYMPHSABS 2.3 12/09/2013 1647   MONOABS 0.4 12/09/2013 1647   EOSABS 0.1 12/09/2013 1647   BASOSABS 0.0 12/09/2013 1647      Chest Imaging:  CT Chest 01/14/21 with bronchiectasis, scar, TIB, atelectasis in RML, lingula  Pulmonary Functions Testing Results: No flowsheet data found. None  Echocardiogram:   2015 TTE EF normal, G1DD      Assessment & Plan:   # RML, lingula bronchiectasis with exacerbation # Centrilobular nodules with TIB  Suspicious for NTM infection or bronchiectasis with exacerbation caused by other organism. Etiology of bronchiectasis unclear unless caused by NTM infection.   Plan: - bronchiectasis lab workup: RF, CCP, ANA, IgE, Ig levels - consider sweat chloride next visit if above unrevealing - AFB sputum - Sputum culture - bronchoscopy/BAL if unrevealing - albuterol inhaler 2 puffs BID. If sensation of rapid heart rate, discontinue - flutter valve 10 slow but firm puffs twice daily after albuterol inhaler - PFTs at next visit     Maryjane Hurter, MD [facility] 01/21/2021 9:42 AM

## 2021-01-20 NOTE — H&P (View-Only) (Signed)
 Synopsis: Referred for atypical pneumonia by Ross, Charles Alan, MD  Subjective:   PATIENT ID: Megan Michael GENDER: female DOB: 02/26/1956, MRN: [id number]  Chief Complaint  Patient presents with   Consult    Patient reports that she is still having some cough, congestion and PNA, She had a CT.    65yF with history of asthma, c-spine disc replacement in 2010 who is referred for atypical pneumonia by PCP.  She says she has 'never had good lungs.' Was told she had issues when she was in her 20s. Has had pneumonia before, never hospitalized. Frequently gets a cough that lingers after URIs. Had covid in May, took paxlovid. She was fully vaccinated for covid.   She does have a productive cough but it takes some work to expectorate. No hemoptysis. Minor weight loss unintentionally (6 lb since she had covid). No fever, night sweats.  No frank aspiration that she recalls unless she's gotten botox for migraines.   She has son that has frequently had lung issues. Says he gets stridor frequently. Has asthma. No other lung issues in family.  Otherwise pertinent review of systems is negative.  Never smoker, vaping. She was a preschool teacher. She lived in [place] for 4.5 years in [place], [place]. She has 5 cats, 3 dogs. She did have a pet bird 15 years ago, parakeets. She does not have a hot tub.   Past Medical History:  Diagnosis Date   Asthma    Hyperlipidemia    Migraines    Sinus tachycardia      Family History  Problem Relation Age of Onset   Hypertension Mother    Breast cancer Mother 74       w/ recurrence and metastasis to intestines at 76   Diabetes Mellitus I Father    Heart attack Father        +EtOH   Cancer - Prostate Father        dx. mid-late 60s   Skin cancer Father        NOS type dx. later age   Colon cancer Father        dx. after prostate cancer   Diabetes Mellitus I Brother    Prostate cancer Brother 64       unspecified Gleason score   Bipolar disorder Maternal  Uncle    Stroke Maternal Grandfather    Heart disease Maternal Grandfather    Diabetes Mellitus I Brother    Prostate cancer Brother 54       unspecified Gleason score; +surgery   Other Brother        says he has prostate cancer, but pt not so sure     Past Surgical History:  Procedure Laterality Date   DISK REPLACEMENT  2010   CERVICAL SPINE     Social History   Socioeconomic History   Marital status: Married    Spouse name: Not on file   Number of children: Not on file   Years of education: Not on file   Highest education level: Not on file  Occupational History   Not on file  Tobacco Use   Smoking status: Never   Smokeless tobacco: Never  Substance and Sexual Activity   Alcohol use: No    Comment: "not really"   Drug use: Not on file   Sexual activity: Not on file  Other Topics Concern   Not on file  Social History Narrative   Not on file   Social Determinants of   Health   Financial Resource Strain: Not on file  Food Insecurity: Not on file  Transportation Needs: Not on file  Physical Activity: Not on file  Stress: Not on file  Social Connections: Not on file  Intimate Partner Violence: Not on file     Allergies  Allergen Reactions   Penicillins Swelling    RASH AND TONGUE SWELLING   Amoxicillin-Pot Clavulanate Rash    "Patient reports it makes her break out".     Outpatient Medications Prior to Visit  Medication Sig Dispense Refill   AIMOVIG 140 MG/ML SOAJ Inject 1 Dose into the skin every 30 (thirty) days.     atorvastatin (LIPITOR) 20 MG tablet TAKE 1 TABLET BY MOUTH EVERY DAY 90 tablet 2   Azelaic Acid 15 % cream Apply 1 application topically 2 (two) times daily.     buPROPion (WELLBUTRIN XL) 150 MG 24 hr tablet Take 3 tablets by mouth daily.     calcium-vitamin D (OSCAL WITH D) 500-200 MG-UNIT TABS tablet Take by mouth.     cyanocobalamin 1000 MCG tablet Take by mouth.     fluticasone (FLONASE) 50 MCG/ACT nasal spray Place into the nose.      gabapentin (NEURONTIN) 600 MG tablet Take 900 mg by mouth daily.  4   methocarbamol (ROBAXIN) 500 MG tablet Take 500-1,000 mg by mouth 3 (three) times daily as needed.     metoprolol succinate (TOPROL-XL) 25 MG 24 hr tablet TAKE 2 TABLETS BY MOUTH EVERY DAY 180 tablet 0   Multiple Vitamins-Minerals (MULTIVITAMIN ADULT PO) Take 1 tablet by mouth daily.     Olopatadine HCl 0.2 % SOLN Place 1 drop into both eyes daily.  5   promethazine (PHENERGAN) 12.5 MG tablet Take 12.5 mg by mouth every 6 (six) hours as needed for nausea or vomiting.     rizatriptan (MAXALT-MLT) 10 MG disintegrating tablet Take 10 mg by mouth daily as needed for migraine.      TOPAMAX 100 MG tablet Take 100 mg by mouth daily.      VENTOLIN HFA 108 (90 Base) MCG/ACT inhaler Inhale 2 puffs into the lungs every 6 (six) hours as needed for shortness of breath.  1   predniSONE (STERAPRED UNI-PAK 21 TAB) 10 MG (21) TBPK tablet TAKE 6 TABLETS ON DAY 1 AS DIRECTED ON PACKAGE AND DECREASE BY 1 TAB EACH DAY FOR A TOTAL OF 6 DAYS     azithromycin (ZITHROMAX) 250 MG tablet TAKE 2 TABLETS BY MOUTH TODAY, THEN TAKE 1 TABLET DAILY FOR 4 DAYS     clarithromycin (BIAXIN) 500 MG tablet SMARTSIG:1 Tablet(s) By Mouth Every 12 Hours     doxycycline (VIBRAMYCIN) 100 MG capsule Take 100 mg by mouth 2 (two) times daily.     fluticasone (FLONASE) 50 MCG/ACT nasal spray Place into both nostrils daily.     No facility-administered medications prior to visit.       Objective:   Physical Exam:  General appearance: 65 y.o., female, NAD, conversant  Eyes: anicteric sclerae, moist conjunctivae; no lid-lag; PERRL, tracking appropriately HENT: NCAT; oropharynx, MMM, no mucosal ulcerations; normal hard and soft palate Neck: Trachea midline; no lymphadenopathy, no JVD Lungs: CTAB, no crackles, no wheeze, with normal respiratory effort CV: RRR, no MRGs  Abdomen: Soft, non-tender; non-distended, BS present  Extremities: No peripheral edema, radial and DP  pulses present bilaterally  Skin: Normal temperature, turgor and texture; no rash Psych: Appropriate affect Neuro: Alert and oriented to person and place, no focal deficit      Vitals:   01/21/21 0912  BP: 100/72  Pulse: 66  Temp: 97.6 F (36.4 C)  TempSrc: Oral  SpO2: 100%  Weight: 100 lb 12.8 oz (45.7 kg)  Height: 5' 4.5" (1.638 m)   100% on RA BMI Readings from Last 3 Encounters:  01/21/21 17.04 kg/m  01/06/21 17.74 kg/m  10/29/20 17.85 kg/m   Wt Readings from Last 3 Encounters:  01/21/21 100 lb 12.8 oz (45.7 kg)  01/06/21 105 lb (47.6 kg)  10/29/20 105 lb 9.6 oz (47.9 kg)     CBC    Component Value Date/Time   WBC 8.5 12/09/2013 1647   RBC 4.06 12/09/2013 1647   HGB 13.0 12/09/2013 1647   HCT 39.0 12/09/2013 1647   PLT 310.0 12/09/2013 1647   MCV 96.0 12/09/2013 1647   MCHC 33.3 12/09/2013 1647   RDW 13.0 12/09/2013 1647   LYMPHSABS 2.3 12/09/2013 1647   MONOABS 0.4 12/09/2013 1647   EOSABS 0.1 12/09/2013 1647   BASOSABS 0.0 12/09/2013 1647      Chest Imaging:  CT Chest 01/14/21 with bronchiectasis, scar, TIB, atelectasis in RML, lingula  Pulmonary Functions Testing Results: No flowsheet data found. None  Echocardiogram:   2015 TTE EF normal, G1DD      Assessment & Plan:   # RML, lingula bronchiectasis with exacerbation # Centrilobular nodules with TIB  Suspicious for NTM infection or bronchiectasis with exacerbation caused by other organism. Etiology of bronchiectasis unclear unless caused by NTM infection.   Plan: - bronchiectasis lab workup: RF, CCP, ANA, IgE, Ig levels - consider sweat chloride next visit if above unrevealing - AFB sputum - Sputum culture - bronchoscopy/BAL if unrevealing - albuterol inhaler 2 puffs BID. If sensation of rapid heart rate, discontinue - flutter valve 10 slow but firm puffs twice daily after albuterol inhaler - PFTs at next visit     Sedrick Tober M Oluwasemilore Pascuzzi, MD [facility] 01/21/2021 9:42 AM   

## 2021-01-21 ENCOUNTER — Encounter: Payer: Self-pay | Admitting: Student

## 2021-01-21 ENCOUNTER — Other Ambulatory Visit: Payer: Self-pay

## 2021-01-21 ENCOUNTER — Ambulatory Visit (INDEPENDENT_AMBULATORY_CARE_PROVIDER_SITE_OTHER): Payer: BC Managed Care – PPO | Admitting: Student

## 2021-01-21 VITALS — BP 100/72 | HR 66 | Temp 97.6°F | Ht 64.5 in | Wt 100.8 lb

## 2021-01-21 DIAGNOSIS — J189 Pneumonia, unspecified organism: Secondary | ICD-10-CM

## 2021-01-21 DIAGNOSIS — J470 Bronchiectasis with acute lower respiratory infection: Secondary | ICD-10-CM

## 2021-01-21 DIAGNOSIS — R918 Other nonspecific abnormal finding of lung field: Secondary | ICD-10-CM

## 2021-01-21 LAB — CBC WITH DIFFERENTIAL/PLATELET
Basophils Absolute: 0 10*3/uL (ref 0.0–0.1)
Basophils Relative: 0.7 % (ref 0.0–3.0)
Eosinophils Absolute: 0.7 10*3/uL (ref 0.0–0.7)
Eosinophils Relative: 9.8 % — ABNORMAL HIGH (ref 0.0–5.0)
HCT: 39.1 % (ref 36.0–46.0)
Hemoglobin: 13.1 g/dL (ref 12.0–15.0)
Lymphocytes Relative: 24.6 % (ref 12.0–46.0)
Lymphs Abs: 1.7 10*3/uL (ref 0.7–4.0)
MCHC: 33.4 g/dL (ref 30.0–36.0)
MCV: 97.5 fl (ref 78.0–100.0)
Monocytes Absolute: 0.6 10*3/uL (ref 0.1–1.0)
Monocytes Relative: 8.8 % (ref 3.0–12.0)
Neutro Abs: 3.9 10*3/uL (ref 1.4–7.7)
Neutrophils Relative %: 56.1 % (ref 43.0–77.0)
Platelets: 285 10*3/uL (ref 150.0–400.0)
RBC: 4.01 Mil/uL (ref 3.87–5.11)
RDW: 12.6 % (ref 11.5–15.5)
WBC: 6.9 10*3/uL (ref 4.0–10.5)

## 2021-01-21 NOTE — Patient Instructions (Signed)
-   Try to obtain AFB sputum, Sputum culture - Consider bronchoscopy/BAL if unrevealing - Continue albuterol inhaler 2 puffs twice daily. If sensation of rapid heart rate, discontinue - Flutter valve 10 slow but firm puffs twice daily after albuterol inhaler - PFTs at next visit

## 2021-01-22 ENCOUNTER — Other Ambulatory Visit: Payer: BC Managed Care – PPO

## 2021-01-22 DIAGNOSIS — J470 Bronchiectasis with acute lower respiratory infection: Secondary | ICD-10-CM | POA: Diagnosis not present

## 2021-01-22 NOTE — Telephone Encounter (Signed)
NM please advise. Thanks   Megan Michael P  P [facility] Good morning,  I'll be dropping off the sputum samples this morning. I did not have a good night, waking up several times with a dry cough. I was prescribed a drug when I had Covid that numbs my bronchioles so they won't spasm. I take this rx as needed and it helps. I took it last night so I could get some sleep.   The flutter brought on quite a reaction this morning. It's a good thing.   I failed to mention probably 15 or so years ago my neurologist thought I might have chronic fatigue syndrome. I assured him I did not.  /-:   Lastly,  I don't feel like I'm getting better, especially after last night. This coughing can create very shape pain to my head (ice pick in migraine parlance) and I have coughed to vomit.     Thank you so much for your time.  Megan Michael

## 2021-01-25 LAB — ANA: Anti Nuclear Antibody (ANA): NEGATIVE

## 2021-01-25 LAB — IGG, IGA, IGM
IgG (Immunoglobin G), Serum: 597 mg/dL — ABNORMAL LOW (ref 600–1540)
IgM, Serum: 63 mg/dL (ref 50–300)
Immunoglobulin A: 104 mg/dL (ref 70–320)

## 2021-01-25 LAB — RESPIRATORY CULTURE OR RESPIRATORY AND SPUTUM CULTURE
MICRO NUMBER:: 12475467
RESULT:: NORMAL
SPECIMEN QUALITY:: ADEQUATE

## 2021-01-25 LAB — CYCLIC CITRUL PEPTIDE ANTIBODY, IGG: Cyclic Citrullin Peptide Ab: <16 UNITS

## 2021-01-25 LAB — RHEUMATOID FACTOR: Rheumatoid fact SerPl-aCnc: <14 IU/mL (ref <14)

## 2021-01-25 LAB — IGE: IgE (Immunoglobulin E), Serum: 8 kU/L (ref <=114)

## 2021-01-26 ENCOUNTER — Telehealth: Payer: Self-pay | Admitting: Student

## 2021-01-26 DIAGNOSIS — J471 Bronchiectasis with (acute) exacerbation: Secondary | ICD-10-CM

## 2021-01-26 NOTE — Telephone Encounter (Signed)
Called and discussed her negative sputum culture. High suspicion still for NTM, awaiting 1st AFB result. We discussed options including trial of 2 week course of moxifloxacin (amoxicillin allergy) for bronchiectasis exacerbation even though resp culture was negative, repeat AFB sputum culture now and if AFBs negative after 6 weeks then proceeding with bronchoscopy if she was still feeling symptomatic, or proceeding with bronchoscopy now to speed up workup a bit. She desires expedited workup of possible NTM disease and will plan for bronch/BAL under general anesthesia (anxiety).   [place]to Pass

## 2021-01-26 NOTE — Telephone Encounter (Signed)
January 26, 2021 Maryjane Hurter, MD to [facility]  [facility] Procedure     12:58 PM Please schedule the following:   Provider performing procedure:Meier  Diagnosis: bronchiectasis exacerbation  Which side if for nodule / mass? N/a  Procedure: video bronchoscopy, bronchoalveolar lavage  Has patient been spoken to by Provider and given informed consent? Yes  Anesthesia: General  Do you need Fluro? No  Duration of procedure: 30 minutes  Date: Prefer 01/29/21  Alternate Date: 02/04/21 AM or 02/05/21 AM  Time: PM preferred on 01/29/21  Location: [facility] if on 01/29/21, [facility] if 02/04/21 or 02/05/21  Does patient have OSA? No DM? No Or Latex allergy? No  Medication Restriction: None  Anticoagulate/Antiplatelet: None  Pre-op Labs Ordered:determined by Anesthesia  Imaging request: None  (If, SuperDimension CT Chest, please have STAT courier sent to ENDO)   Please coordinate Pre-op COVID Testing    Called and spoke with Endo at [facility] Lattie Haw)- procedure set for 11:30 am on 02/04/21. Pt needs to arrive at 10 am. Pt needs to be NPO after midnight the night before and can take her meds with a sip of water. Per Dr [name], no covid testing needed for this pt. She has been fully vaxxed. I spoke with her and she says this date and time is okay with her. She is aware someone will need to drive her and NPO p midnight.   Pt says she was hoping for a sooner date and I advised unfortunately this was the soonest they could accommodate. She asks that since it's not happening sooner, does she need to go ahead and start on abx. Dr [name], please advise on this. She says that if you could repond via [place] that would be preferred. Thanks.   Allergies  Allergen Reactions   Penicillins Swelling    RASH AND TONGUE SWELLING   Amoxicillin-Pot Clavulanate Rash    "Patient reports it makes her break out".

## 2021-01-26 NOTE — Telephone Encounter (Signed)
Dr Thora Lance, pt asking if you have any new recommendations for her since her respiratory culture is back   Adequate   Source SPUTUM   STATUS: FINAL   GRAM STAIN:  Abnormal  Few epithelial cells Moderate Polymorphonuclear leukocytes Moderate Gram positive cocci in pairs Gram stain indicates that the specimen is representative of the lower respiratory tract.  RESULT: Growth of normal oropharyngeal flora.    Please advise, thanks!

## 2021-01-27 MED ORDER — MOXIFLOXACIN HCL 400 MG PO TABS: 400.0000 mg | ORAL_TABLET | Freq: Every day | ORAL | 0 refills | Status: AC

## 2021-01-27 NOTE — Addendum Note (Signed)
Addended by: Omar Person on: 01/27/2021 05:02 PM   Modules accepted: Orders

## 2021-02-01 NOTE — Telephone Encounter (Signed)
Dr. Meier, please see mychart message sent by pt and advise. 

## 2021-02-04 ENCOUNTER — Encounter (HOSPITAL_COMMUNITY): Payer: Self-pay | Admitting: Student

## 2021-02-04 ENCOUNTER — Ambulatory Visit (HOSPITAL_COMMUNITY): Payer: BC Managed Care – PPO | Admitting: Anesthesiology

## 2021-02-04 ENCOUNTER — Encounter (HOSPITAL_COMMUNITY)
Admission: RE | Disposition: A | Discharge status: Home / Self Care | Payer: Self-pay | Source: Home / Self Care | Attending: Student

## 2021-02-04 ENCOUNTER — Other Ambulatory Visit: Payer: Self-pay

## 2021-02-04 ENCOUNTER — Ambulatory Visit (HOSPITAL_COMMUNITY)
Admission: RE | Admit: 2021-02-04 | Discharge: 2021-02-04 | Disposition: A | Discharge status: Home / Self Care | Payer: BC Managed Care – PPO | Attending: Student | Admitting: Student

## 2021-02-04 DIAGNOSIS — Z8616 Personal history of COVID-19: Secondary | ICD-10-CM | POA: Diagnosis not present

## 2021-02-04 DIAGNOSIS — Z880 Allergy status to penicillin: Secondary | ICD-10-CM | POA: Insufficient documentation

## 2021-02-04 DIAGNOSIS — J471 Bronchiectasis with (acute) exacerbation: Secondary | ICD-10-CM | POA: Diagnosis not present

## 2021-02-04 DIAGNOSIS — Z7951 Long term (current) use of inhaled steroids: Secondary | ICD-10-CM | POA: Insufficient documentation

## 2021-02-04 DIAGNOSIS — Z79899 Other long term (current) drug therapy: Secondary | ICD-10-CM | POA: Insufficient documentation

## 2021-02-04 DIAGNOSIS — Z8042 Family history of malignant neoplasm of prostate: Secondary | ICD-10-CM | POA: Diagnosis not present

## 2021-02-04 DIAGNOSIS — Z808 Family history of malignant neoplasm of other organs or systems: Secondary | ICD-10-CM | POA: Insufficient documentation

## 2021-02-04 DIAGNOSIS — Z833 Family history of diabetes mellitus: Secondary | ICD-10-CM | POA: Diagnosis not present

## 2021-02-04 DIAGNOSIS — J45909 Unspecified asthma, uncomplicated: Secondary | ICD-10-CM | POA: Diagnosis not present

## 2021-02-04 DIAGNOSIS — Z8249 Family history of ischemic heart disease and other diseases of the circulatory system: Secondary | ICD-10-CM | POA: Insufficient documentation

## 2021-02-04 DIAGNOSIS — M810 Age-related osteoporosis without current pathological fracture: Secondary | ICD-10-CM | POA: Diagnosis not present

## 2021-02-04 DIAGNOSIS — E785 Hyperlipidemia, unspecified: Secondary | ICD-10-CM | POA: Diagnosis not present

## 2021-02-04 DIAGNOSIS — G43909 Migraine, unspecified, not intractable, without status migrainosus: Secondary | ICD-10-CM | POA: Diagnosis not present

## 2021-02-04 DIAGNOSIS — Z823 Family history of stroke: Secondary | ICD-10-CM | POA: Insufficient documentation

## 2021-02-04 HISTORY — PX: VIDEO BRONCHOSCOPY: SHX5072

## 2021-02-04 HISTORY — PX: BRONCHIAL WASHINGS: SHX5105

## 2021-02-04 LAB — BODY FLUID CELL COUNT WITH DIFFERENTIAL
Eos, Fluid: 4 %
Lymphs, Fluid: 24 %
Monocyte-Macrophage-Serous Fluid: 9 % — ABNORMAL LOW (ref 50–90)
Neutrophil Count, Fluid: 63 % — ABNORMAL HIGH (ref 0–25)
Total Nucleated Cell Count, Fluid: 43 cu mm (ref 0–1000)

## 2021-02-04 SURGERY — VIDEO BRONCHOSCOPY WITHOUT FLUORO
Anesthesia: General

## 2021-02-04 MED ORDER — SUGAMMADEX SODIUM 200 MG/2ML IV SOLN
INTRAVENOUS | Status: DC | PRN
  Administered 2021-02-04: 100 mg via INTRAVENOUS

## 2021-02-04 MED ORDER — DEXAMETHASONE SODIUM PHOSPHATE 10 MG/ML IJ SOLN
INTRAMUSCULAR | Status: DC | PRN
  Administered 2021-02-04: 5 mg via INTRAVENOUS

## 2021-02-04 MED ORDER — FENTANYL CITRATE (PF) 100 MCG/2ML IJ SOLN
INTRAMUSCULAR | Status: AC
  Filled 2021-02-04: qty 2

## 2021-02-04 MED ORDER — PROPOFOL 10 MG/ML IV BOLUS
INTRAVENOUS | Status: DC | PRN
  Administered 2021-02-04: 90 mg via INTRAVENOUS

## 2021-02-04 MED ORDER — FENTANYL CITRATE (PF) 250 MCG/5ML IJ SOLN
INTRAMUSCULAR | Status: DC | PRN
  Administered 2021-02-04 (×2): 50 ug via INTRAVENOUS

## 2021-02-04 MED ORDER — LIDOCAINE 2% (20 MG/ML) 5 ML SYRINGE
INTRAMUSCULAR | Status: DC | PRN
  Administered 2021-02-04: 40 mg via INTRAVENOUS

## 2021-02-04 MED ORDER — LACTATED RINGERS IV SOLN
INTRAVENOUS | Status: DC | PRN
Start: 2021-02-04 — End: 2021-02-04

## 2021-02-04 MED ORDER — PROPOFOL 10 MG/ML IV BOLUS
INTRAVENOUS | Status: AC
  Filled 2021-02-04: qty 20

## 2021-02-04 MED ORDER — ROCURONIUM BROMIDE 10 MG/ML (PF) SYRINGE
PREFILLED_SYRINGE | INTRAVENOUS | Status: DC | PRN
  Administered 2021-02-04: 30 mg via INTRAVENOUS

## 2021-02-04 MED ORDER — BENZONATATE 200 MG PO CAPS: 200.0000 mg | ORAL_CAPSULE | Freq: Three times a day (TID) | ORAL | 1 refills | Status: DC | PRN

## 2021-02-04 NOTE — Interval H&P Note (Signed)
History and Physical Interval Note:  02/04/2021 11:16 AM  Megan Michael  has presented today for surgery, with the diagnosis of Bronchiectasis exacerbation.  The various methods of treatment have been discussed with the patient and family. After consideration of risks, benefits and other options for treatment, the patient has consented to  Procedure(s): VIDEO BRONCHOSCOPY WITHOUT FLUORO (N/A) as a surgical intervention.  The patient's history has been reviewed, patient examined, no change in status, stable for surgery.  I have reviewed the patient's chart and labs.  Questions were answered to the patient's satisfaction.     Omar Person

## 2021-02-04 NOTE — Discharge Instructions (Signed)
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 We may need to do even more to help you cough up secretions - Keep taking moxifloxacin until gone unless you hear from me about results of bronch that would warrant changing antibiotic - benzonatate (sent prescription today) and/or dextromethorphan may help deal with cough in short term

## 2021-02-04 NOTE — Anesthesia Postprocedure Evaluation (Signed)
Anesthesia Post Note  Patient: Megan Michael  Procedure(s) Performed: VIDEO BRONCHOSCOPY WITHOUT FLUORO BRONCHIAL WASHINGS     Patient location during evaluation: PACU Anesthesia Type: General Level of consciousness: awake and alert and oriented Pain management: pain level controlled Vital Signs Assessment: post-procedure vital signs reviewed and stable Respiratory status: spontaneous breathing, nonlabored ventilation and respiratory function stable Cardiovascular status: blood pressure returned to baseline Postop Assessment: no apparent nausea or vomiting Anesthetic complications: no   No notable events documented.  Last Vitals:  Vitals:   02/04/21 1232 02/04/21 1235  BP:    Pulse: 78 77  Resp: 20 17  Temp:    SpO2: 94% 95%    Last Pain:  Vitals:   02/04/21 1225  TempSrc:   PainSc: 0-No pain                 Shanda Howells

## 2021-02-04 NOTE — Op Note (Signed)
Bronchoscopy Procedure Note  Megan Michael  [id number]  [birth year]  Date:02/04/21  Time:11:52 AM   Provider Performing:Nivek Powley M [name]   Procedure(s):  Flexible Bronchoscopy [phone]) and Flexible bronchoscopy with bronchial alveolar lavage (24580)  Indication(s) Bronchiectasis with exacerbation  Consent Risks of the procedure as well as the alternatives and risks of each were explained to the patient and/or caregiver.  Consent for the procedure was obtained and is signed in the bedside chart  Anesthesia General   Time Out Verified patient identification, verified procedure, site/side was marked, verified correct patient position, special equipment/implants available, medications/allergies/relevant history reviewed, required imaging and test results available.   Sterile Technique Usual hand hygiene, masks, gowns, and gloves were used   Procedure Description Bronchoscope advanced through endotracheal tube and into airway.  Airways were examined down to subsegmental level with findings noted below.   Following diagnostic evaluation, BAL initially attempted in RML however excessive collapse with even minimal suction precluded collection of adequate specimen - what was obtained was sent for RML washing cultures. BAL then performed in RUL anterior segment with 60cc instilled and 20cc return.   Findings:  - RML syndrome - Anthracotic pigment over inferior aspect of RUL - Edematous, friable mucosa over anterior RMB and RML with occasional airway pitting indicative of acute on chronic bronchitis - Scattered thin secretions which were easily suctioned   Complications/Tolerance None; patient tolerated the procedure well. Chest X-ray is not needed post procedure.   EBL Minimal   Specimen(s) RML wash sent for cultures RUL BAL sent for cell count diff, cultures

## 2021-02-04 NOTE — Anesthesia Preprocedure Evaluation (Addendum)
Anesthesia Evaluation  Patient identified by MRN, date of birth, ID band Patient awake    Reviewed: Allergy & Precautions, NPO status , Patient's Chart, lab work & pertinent test results, reviewed documented beta blocker date and time   History of Anesthesia Complications Negative for: history of anesthetic complications  Airway Mallampati: II  TM Distance: >3 FB Neck ROM: Full    Dental no notable dental hx.    Pulmonary asthma ,  bronchiectasis   Pulmonary exam normal        Cardiovascular Normal cardiovascular exam     Neuro/Psych  Headaches, negative psych ROS   GI/Hepatic negative GI ROS, Neg liver ROS,   Endo/Other  negative endocrine ROS  Renal/GU negative Renal ROS  negative genitourinary   Musculoskeletal negative musculoskeletal ROS (+)   Abdominal   Peds  Hematology negative hematology ROS (+)   Anesthesia Other Findings Day of surgery medications reviewed with patient.  Reproductive/Obstetrics negative OB ROS                            Anesthesia Physical Anesthesia Plan  ASA: 3  Anesthesia Plan: General   Post-op Pain Management:    Induction: Intravenous  PONV Risk Score and Plan: 3 and Treatment may vary due to age or medical condition, Midazolam, Ondansetron and Dexamethasone  Airway Management Planned: Oral ETT  Additional Equipment: None  Intra-op Plan:   Post-operative Plan: Extubation in OR  Informed Consent: I have reviewed the patients History and Physical, chart, labs and discussed the procedure including the risks, benefits and alternatives for the proposed anesthesia with the patient or authorized representative who has indicated his/her understanding and acceptance.     Dental advisory given  Plan Discussed with: CRNA  Anesthesia Plan Comments:        Anesthesia Quick Evaluation

## 2021-02-04 NOTE — Anesthesia Procedure Notes (Addendum)
Procedure Name: Intubation Date/Time: 02/04/2021 11:33 AM Performed by: Ngai Parcell, Clinical cytogeneticist D, CRNA Pre-anesthesia Checklist: Patient identified, Emergency Drugs available, Suction available and Patient being monitored Patient Re-evaluated:Patient Re-evaluated prior to induction Oxygen Delivery Method: Circle system utilized Preoxygenation: Pre-oxygenation with 100% oxygen Induction Type: IV induction Ventilation: Mask ventilation without difficulty Laryngoscope Size: Mac and 3 Grade View: Grade I Tube type: Oral Number of attempts: 1 (By DR Randie Heinz) Airway Equipment and Method: Stylet Placement Confirmation: ETT inserted through vocal cords under direct vision, positive ETCO2 and breath sounds checked- equal and bilateral Secured at: 21 cm Tube secured with: Tape Dental Injury: Teeth and Oropharynx as per pre-operative assessment

## 2021-02-04 NOTE — Transfer of Care (Signed)
Immediate Anesthesia Transfer of Care Note  Patient: Megan Michael  Procedure(s) Performed: VIDEO BRONCHOSCOPY WITHOUT FLUORO BRONCHIAL WASHINGS  Patient Location: PACU  Anesthesia Type:General  Level of Consciousness: awake, alert  and oriented  Airway & Oxygen Therapy: Patient Spontanous Breathing and Patient connected to face mask oxygen  Post-op Assessment: Report given to RN and Post -op Vital signs reviewed and stable  Post vital signs: Reviewed and stable  Last Vitals:  Vitals Value Taken Time  BP    Temp    Pulse    Resp    SpO2      Last Pain:  Vitals:   02/04/21 1050  TempSrc: Oral  PainSc: 0-No pain         Complications: No notable events documented.

## 2021-02-05 ENCOUNTER — Encounter (HOSPITAL_COMMUNITY): Payer: Self-pay | Admitting: Student

## 2021-02-06 LAB — CULTURE, RESPIRATORY W GRAM STAIN
Culture: NO GROWTH
Culture: NO GROWTH
Gram Stain: NONE SEEN

## 2021-02-08 ENCOUNTER — Ambulatory Visit (INDEPENDENT_AMBULATORY_CARE_PROVIDER_SITE_OTHER): Payer: BC Managed Care – PPO | Admitting: *Deleted

## 2021-02-08 DIAGNOSIS — M810 Age-related osteoporosis without current pathological fracture: Secondary | ICD-10-CM

## 2021-02-08 LAB — PATHOLOGIST SMEAR REVIEW

## 2021-02-08 LAB — ASPERGILLUS ANTIGEN, BAL/SERUM: Aspergillus Ag, BAL/Serum: 0.25 Index (ref 0.00–0.49)

## 2021-02-08 NOTE — Progress Notes (Signed)
Patient is here for nurse visit for Evenity. Patient received both SQ injections in her left arm. Patient tolerated injections well. She will return next month for her last Evenity inj.

## 2021-02-11 LAB — ACID FAST SMEAR (AFB, MYCOBACTERIA)
Acid Fast Smear: NEGATIVE
Acid Fast Smear: NEGATIVE

## 2021-02-16 MED ORDER — MOXIFLOXACIN HCL 400 MG PO TABS: 400.0000 mg | ORAL_TABLET | Freq: Every day | ORAL | 0 refills | Status: DC

## 2021-02-16 NOTE — Telephone Encounter (Signed)
Dr. Thora Lance, please see pt's mychart message and advise.

## 2021-02-16 NOTE — Telephone Encounter (Signed)
Called and discussed bronch and AFB sputum results. Waiting on BAL AFB culture. Will extend empiric course of ABX for empiric treatment of bronchiectasis exacerbation with moxifloxacin to complete 3 total weeks (one additional week prescribed tonight). If positive BAL AFB culture then will refer to ID. If negative but persistent symptoms after completing empiric course of ABX for bronchiectasis exacerbation then will repeat AFB sputum and refer to ID.

## 2021-02-18 ENCOUNTER — Telehealth: Payer: Self-pay | Admitting: Student

## 2021-02-18 NOTE — Telephone Encounter (Signed)
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 [name] they are also faxing over the results

## 2021-02-19 LAB — AFB CULTURE WITH SMEAR (NOT AT ARMC)
Acid Fast Culture: POSITIVE — AB
Acid Fast Smear: NEGATIVE

## 2021-02-19 LAB — AFB ID BY DNA PROBE
M avium complex: POSITIVE — AB
M tuberculosis complex: NEGATIVE

## 2021-02-23 DIAGNOSIS — M810 Age-related osteoporosis without current pathological fracture: Secondary | ICD-10-CM | POA: Diagnosis not present

## 2021-02-24 DIAGNOSIS — R0602 Shortness of breath: Secondary | ICD-10-CM | POA: Diagnosis not present

## 2021-02-24 DIAGNOSIS — Z79899 Other long term (current) drug therapy: Secondary | ICD-10-CM | POA: Diagnosis not present

## 2021-02-24 DIAGNOSIS — R942 Abnormal results of pulmonary function studies: Secondary | ICD-10-CM | POA: Diagnosis not present

## 2021-02-24 DIAGNOSIS — J479 Bronchiectasis, uncomplicated: Secondary | ICD-10-CM | POA: Diagnosis not present

## 2021-03-01 ENCOUNTER — Telehealth: Payer: Self-pay | Admitting: Student

## 2021-03-01 NOTE — Telephone Encounter (Signed)
Called and discussed AFB BAL culture - now with 2 positive culture results. Treatment underway at [facility] - transferring her care there.

## 2021-03-03 DIAGNOSIS — G243 Spasmodic torticollis: Secondary | ICD-10-CM | POA: Diagnosis not present

## 2021-03-03 LAB — ACID FAST CULTURE WITH REFLEXED SENSITIVITIES (MYCOBACTERIA): Acid Fast Culture: POSITIVE — AB

## 2021-03-03 LAB — AFB ORGANISM ID BY DNA PROBE
M avium complex: POSITIVE — AB
M tuberculosis complex: NEGATIVE

## 2021-03-03 LAB — MAC SUSCEPTIBILITY BROTH
Amikacin: 64
Ciprofloxacin: >8
Clarithromycin: 2
Linezolid: 32
Moxifloxacin: 4
Rifampin: >4
Streptomycin: >32

## 2021-03-09 DIAGNOSIS — G243 Spasmodic torticollis: Secondary | ICD-10-CM | POA: Diagnosis not present

## 2021-03-09 DIAGNOSIS — R52 Pain, unspecified: Secondary | ICD-10-CM | POA: Diagnosis not present

## 2021-03-09 LAB — AFB ORGANISM ID BY DNA PROBE
M avium complex: POSITIVE — AB
M tuberculosis complex: NEGATIVE

## 2021-03-09 LAB — ACID FAST CULTURE WITH REFLEXED SENSITIVITIES (MYCOBACTERIA): Acid Fast Culture: POSITIVE — AB

## 2021-03-09 NOTE — Telephone Encounter (Signed)
See phone note from 11/14. Will close encounter.

## 2021-03-11 LAB — FUNGUS CULTURE RESULT

## 2021-03-11 LAB — FUNGUS CULTURE WITH STAIN

## 2021-03-11 LAB — FUNGAL ORGANISM REFLEX

## 2021-03-13 ENCOUNTER — Other Ambulatory Visit: Payer: Self-pay | Admitting: Cardiovascular Disease

## 2021-03-14 NOTE — Progress Notes (Deleted)
Synopsis: Referred for atypical pneumonia by Lawerance Cruel, MD  Subjective:   PATIENT ID: Megan Michael GENDER: female DOB: 28-Oct-1955, MRN: [id number]  No chief complaint on file.  65yF with history of asthma, c-spine disc replacement in 2010 who is referred for atypical pneumonia by PCP.  She says she has 'never had good lungs.' Was told she had issues when she was in her 24s. Has had pneumonia before, never hospitalized. Frequently gets a cough that lingers after URIs. Had covid in May, took paxlovid. She was fully vaccinated for covid.   She does have a productive cough but it takes some work to expectorate. No hemoptysis. Minor weight loss unintentionally (6 lb since she had covid). No fever, night sweats.  No frank aspiration that she recalls unless she's gotten botox for migraines.   She has son that has frequently had lung issues. Says he gets stridor frequently. Has asthma. No other lung issues in family.  Otherwise pertinent review of systems is negative.  Never smoker, vaping. She was a Print production planner. She lived in [place] for 4.5 years in [place], [place]. She has 5 cats, 3 dogs. She did have a pet bird 15 years ago, parakeets. She does not have a hot tub.   Interval HPI: BAL, sputum with MAI. Seeing Dr. Corrin Parker at [facility] for NTM/non-CF bronchiectasis, gradually starting azithromycin, ethambutol, rifampin.  Past Medical History:  Diagnosis Date   Asthma    Hyperlipidemia    Migraines    Sinus tachycardia      Family History  Problem Relation Age of Onset   Hypertension Mother    Breast cancer Mother 75       w/ recurrence and metastasis to intestines at 43   Diabetes Mellitus I Father    Heart attack Father        +EtOH   Cancer - Prostate Father        dx. mid-late 50s   Skin cancer Father        NOS type dx. later age   Colon cancer Father        dx. after prostate cancer   Diabetes Mellitus I Brother    Prostate cancer Brother 33        unspecified Gleason score   Bipolar disorder Maternal Uncle    Stroke Maternal Grandfather    Heart disease Maternal Grandfather    Diabetes Mellitus I Brother    Prostate cancer Brother 62       unspecified Gleason score; +surgery   Other Brother        says he has prostate cancer, but pt not so sure     Past Surgical History:  Procedure Laterality Date   BRONCHIAL WASHINGS  02/04/2021   Procedure: BRONCHIAL WASHINGS;  Surgeon: Maryjane Hurter, MD;  Location: [facility];  Service: Pulmonary;;   DISK REPLACEMENT  2010   CERVICAL SPINE    VIDEO BRONCHOSCOPY N/A 02/04/2021   Procedure: VIDEO BRONCHOSCOPY WITHOUT FLUORO;  Surgeon: Maryjane Hurter, MD;  Location: [facility];  Service: Pulmonary;  Laterality: N/A;    Social History   Socioeconomic History   Marital status: Married    Spouse name: Not on file   Number of children: Not on file   Years of education: Not on file   Highest education level: Not on file  Occupational History   Not on file  Tobacco Use   Smoking status: Never   Smokeless tobacco: Never  Substance and Sexual  Activity   Alcohol use: No    Comment: "not really"   Drug use: Not on file   Sexual activity: Not on file  Other Topics Concern   Not on file  Social History Narrative   Not on file   Social Determinants of Health   Financial Resource Strain: Not on file  Food Insecurity: Not on file  Transportation Needs: Not on file  Physical Activity: Not on file  Stress: Not on file  Social Connections: Not on file  Intimate Partner Violence: Not on file     Allergies  Allergen Reactions   Penicillins Swelling and Rash    RASH AND TONGUE SWELLING   Amoxicillin-Pot Clavulanate Rash    "Patient reports it makes her break out".     Outpatient Medications Prior to Visit  Medication Sig Dispense Refill   acetaminophen (TYLENOL) 500 MG tablet Take 500 mg by mouth every 6 (six) hours as needed (pain.).     AIMOVIG 140 MG/ML SOAJ Inject  1 Dose into the skin every 30 (thirty) days.     atorvastatin (LIPITOR) 20 MG tablet TAKE 1 TABLET BY MOUTH EVERY DAY 90 tablet 2   Azelaic Acid 15 % gel Apply 1 application topically at bedtime.     benzonatate (TESSALON) 200 MG capsule Take 1 capsule (200 mg total) by mouth 3 (three) times daily as needed for cough. 30 capsule 1   buPROPion (WELLBUTRIN XL) 150 MG 24 hr tablet Take 450 mg by mouth in the morning.     Calcium Carb-Cholecalciferol (CALCIUM + D3 PO) Take 2 tablets by mouth in the morning.     gabapentin (NEURONTIN) 600 MG tablet Take 300-600 mg by mouth See admin instructions. Take 0.5 tablet (300 mg) by mouth in the morning & take 1 tablet (600 mg) by mouth at night  4   ibuprofen (ADVIL) 200 MG tablet Take 200 mg by mouth every 8 (eight) hours as needed (for pain.).     methocarbamol (ROBAXIN) 500 MG tablet Take 500 mg by mouth daily as needed (muscles spasms/headaches.).     metoprolol succinate (TOPROL-XL) 25 MG 24 hr tablet TAKE 2 TABLETS BY MOUTH EVERY DAY (Patient taking differently: Take 37.5 mg by mouth every evening.) 180 tablet 0   moxifloxacin (AVELOX) 400 MG tablet Take 1 tablet (400 mg total) by mouth daily. 7 tablet 0   NURTEC 75 MG TBDP Take 75 mg by mouth every 2 (two) hours as needed (at onset of migraine (max 3 tablet/24hrs)).     Olopatadine HCl 0.2 % SOLN Place 1 drop into both eyes daily as needed (allergy eyes (DURING SPRING MONTHS ONLY)).  5   Probiotic Product (PROBIOTIC PO) Take 1 capsule by mouth daily in the afternoon.     promethazine (PHENERGAN) 12.5 MG tablet Take 12.5 mg by mouth every 6 (six) hours as needed for nausea or vomiting.     rizatriptan (MAXALT) 10 MG tablet Take 10 mg by mouth every 2 (two) hours as needed (at onset of migraine headaches (max 3 dose/24 hrs.)).     Romosozumab-aqqg (EVENITY [place]) Inject 1 Dose into the skin every 30 (thirty) days.     TOPAMAX 100 MG tablet Take 100 mg by mouth at bedtime.     VENTOLIN HFA 108 (90 Base)  MCG/ACT inhaler Inhale 2 puffs into the lungs every 6 (six) hours as needed for shortness of breath.  1   No facility-administered medications prior to visit.  Objective:   Physical Exam:  General appearance: 65 y.o., female, NAD, conversant  Eyes: anicteric sclerae, moist conjunctivae; no lid-lag; PERRL, tracking appropriately HENT: NCAT; oropharynx, MMM, no mucosal ulcerations; normal hard and soft palate Neck: Trachea midline; no lymphadenopathy, no JVD Lungs: CTAB, no crackles, no wheeze, with normal respiratory effort CV: RRR, no MRGs  Abdomen: Soft, non-tender; non-distended, BS present  Extremities: No peripheral edema, radial and DP pulses present bilaterally  Skin: Normal temperature, turgor and texture; no rash Psych: Appropriate affect Neuro: Alert and oriented to person and place, no focal deficit    There were no vitals filed for this visit.    on RA BMI Readings from Last 3 Encounters:  02/04/21 16.90 kg/m  01/21/21 17.04 kg/m  01/06/21 17.74 kg/m   Wt Readings from Last 3 Encounters:  02/04/21 100 lb (45.4 kg)  01/21/21 100 lb 12.8 oz (45.7 kg)  01/06/21 105 lb (47.6 kg)     CBC    Component Value Date/Time   WBC 6.9 01/21/2021 0941   RBC 4.01 01/21/2021 0941   HGB 13.1 01/21/2021 0941   HCT 39.1 01/21/2021 0941   PLT 285.0 01/21/2021 0941   MCV 97.5 01/21/2021 0941   MCHC 33.4 01/21/2021 0941   RDW 12.6 01/21/2021 0941   LYMPHSABS 1.7 01/21/2021 0941   MONOABS 0.6 01/21/2021 0941   EOSABS 0.7 01/21/2021 0941   BASOSABS 0.0 01/21/2021 0941      Chest Imaging:  CT Chest 01/14/21 with bronchiectasis, scar, TIB, atelectasis in RML, lingula  Pulmonary Functions Testing Results: No flowsheet data found. None  Echocardiogram:   2015 TTE EF normal, G1DD      Assessment & Plan:   # RML, lingula bronchiectasis with exacerbation # Centrilobular nodules with TIB  Suspicious for NTM infection or bronchiectasis with exacerbation  caused by other organism. Etiology of bronchiectasis unclear unless caused by NTM infection.   Plan: - bronchiectasis lab workup: RF, CCP, ANA, IgE, Ig levels - consider sweat chloride next visit if above unrevealing - AFB sputum - Sputum culture - bronchoscopy/BAL if unrevealing - albuterol inhaler 2 puffs BID. If sensation of rapid heart rate, discontinue - flutter valve 10 slow but firm puffs twice daily after albuterol inhaler - PFTs at next visit     Maryjane Hurter, MD [facility] 03/14/2021 6:18 PM

## 2021-03-15 ENCOUNTER — Ambulatory Visit: Payer: BC Managed Care – PPO | Admitting: Student

## 2021-03-15 LAB — FUNGUS CULTURE WITH STAIN

## 2021-03-15 LAB — FUNGUS CULTURE RESULT

## 2021-03-15 LAB — FUNGAL ORGANISM REFLEX

## 2021-03-16 ENCOUNTER — Ambulatory Visit (INDEPENDENT_AMBULATORY_CARE_PROVIDER_SITE_OTHER): Payer: BC Managed Care – PPO | Admitting: *Deleted

## 2021-03-16 DIAGNOSIS — M810 Age-related osteoporosis without current pathological fracture: Secondary | ICD-10-CM

## 2021-03-16 NOTE — Progress Notes (Signed)
Patient is here for nurse visit for Evenity. Patient received both SQ injections in her right arm. Patient tolerated injections well. She will follow up with her PCP and Dr. Cleophas Dunker.  See 01/21/21 DEXA scan in  media.

## 2021-03-23 DIAGNOSIS — E559 Vitamin D deficiency, unspecified: Secondary | ICD-10-CM | POA: Diagnosis not present

## 2021-03-23 DIAGNOSIS — M810 Age-related osteoporosis without current pathological fracture: Secondary | ICD-10-CM | POA: Diagnosis not present

## 2021-03-23 DIAGNOSIS — R5383 Other fatigue: Secondary | ICD-10-CM | POA: Diagnosis not present

## 2021-03-28 NOTE — Telephone Encounter (Signed)
Dr. Jordan Likes,  Pt has completed 12 doses of Evenity. Would you like to transfer to twice year Prolia injection?

## 2021-05-13 DIAGNOSIS — Z79899 Other long term (current) drug therapy: Secondary | ICD-10-CM | POA: Diagnosis not present

## 2021-05-13 DIAGNOSIS — J479 Bronchiectasis, uncomplicated: Secondary | ICD-10-CM | POA: Diagnosis not present

## 2021-05-13 DIAGNOSIS — R0600 Dyspnea, unspecified: Secondary | ICD-10-CM | POA: Diagnosis not present

## 2021-05-30 ENCOUNTER — Other Ambulatory Visit: Payer: Self-pay | Admitting: Cardiovascular Disease

## 2021-06-07 DIAGNOSIS — G243 Spasmodic torticollis: Secondary | ICD-10-CM | POA: Diagnosis not present

## 2021-06-08 DIAGNOSIS — R52 Pain, unspecified: Secondary | ICD-10-CM | POA: Diagnosis not present

## 2021-06-08 DIAGNOSIS — G243 Spasmodic torticollis: Secondary | ICD-10-CM | POA: Diagnosis not present

## 2021-06-09 DIAGNOSIS — E559 Vitamin D deficiency, unspecified: Secondary | ICD-10-CM | POA: Diagnosis not present

## 2021-06-09 DIAGNOSIS — M810 Age-related osteoporosis without current pathological fracture: Secondary | ICD-10-CM | POA: Diagnosis not present

## 2021-06-16 DIAGNOSIS — H5213 Myopia, bilateral: Secondary | ICD-10-CM | POA: Diagnosis not present

## 2021-06-28 DIAGNOSIS — D1801 Hemangioma of skin and subcutaneous tissue: Secondary | ICD-10-CM | POA: Diagnosis not present

## 2021-06-28 DIAGNOSIS — D225 Melanocytic nevi of trunk: Secondary | ICD-10-CM | POA: Diagnosis not present

## 2021-06-28 DIAGNOSIS — L570 Actinic keratosis: Secondary | ICD-10-CM | POA: Diagnosis not present

## 2021-06-28 DIAGNOSIS — L821 Other seborrheic keratosis: Secondary | ICD-10-CM | POA: Diagnosis not present

## 2021-08-08 IMAGING — MG DIGITAL SCREENING BILAT W/ TOMO W/ CAD
6 of 10 series · 6 of 30 positions shown · non-contrast
Comparison: Previous exam(s).

CLINICAL DATA: Screening.

EXAM:
DIGITAL SCREENING BILATERAL MAMMOGRAM WITH TOMO AND CAD

[R MLO synth-2D]
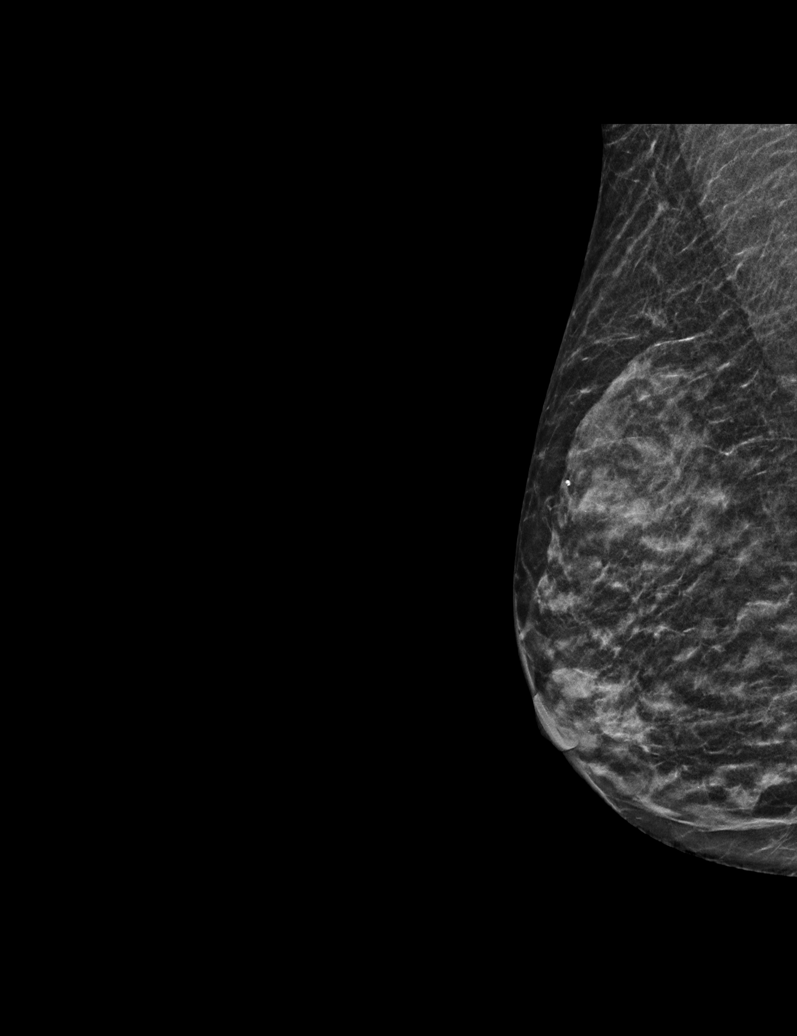

[R CC synth-2D (1 of 2)]
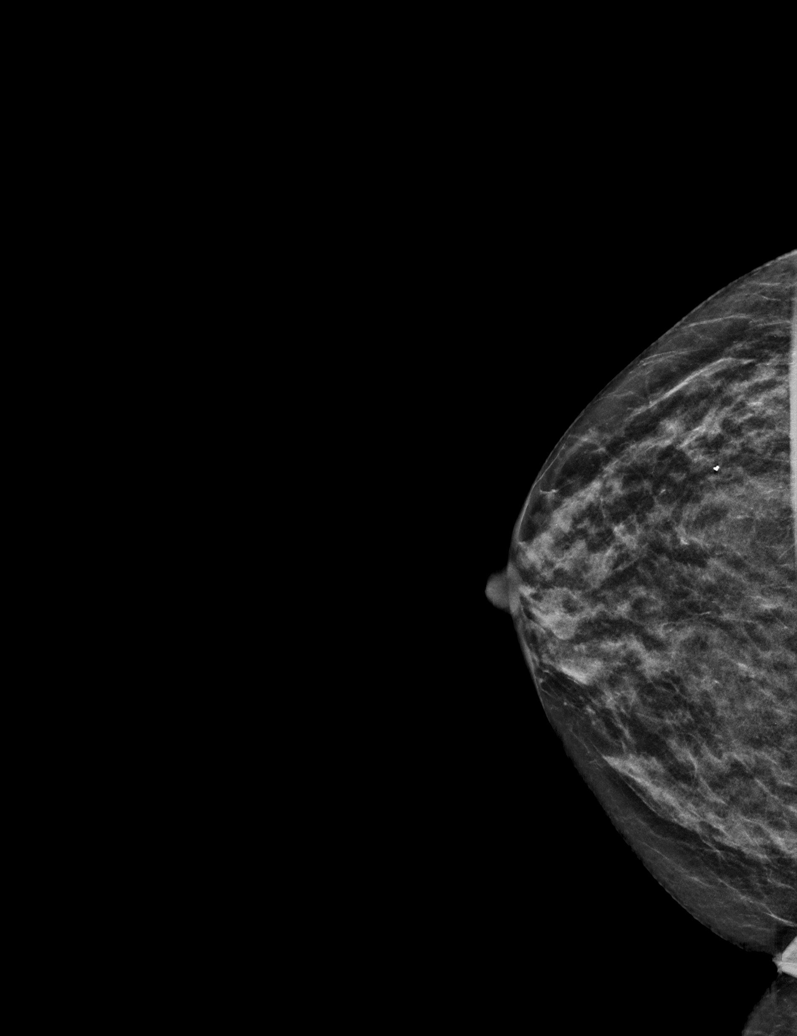

[L CC synth-2D]
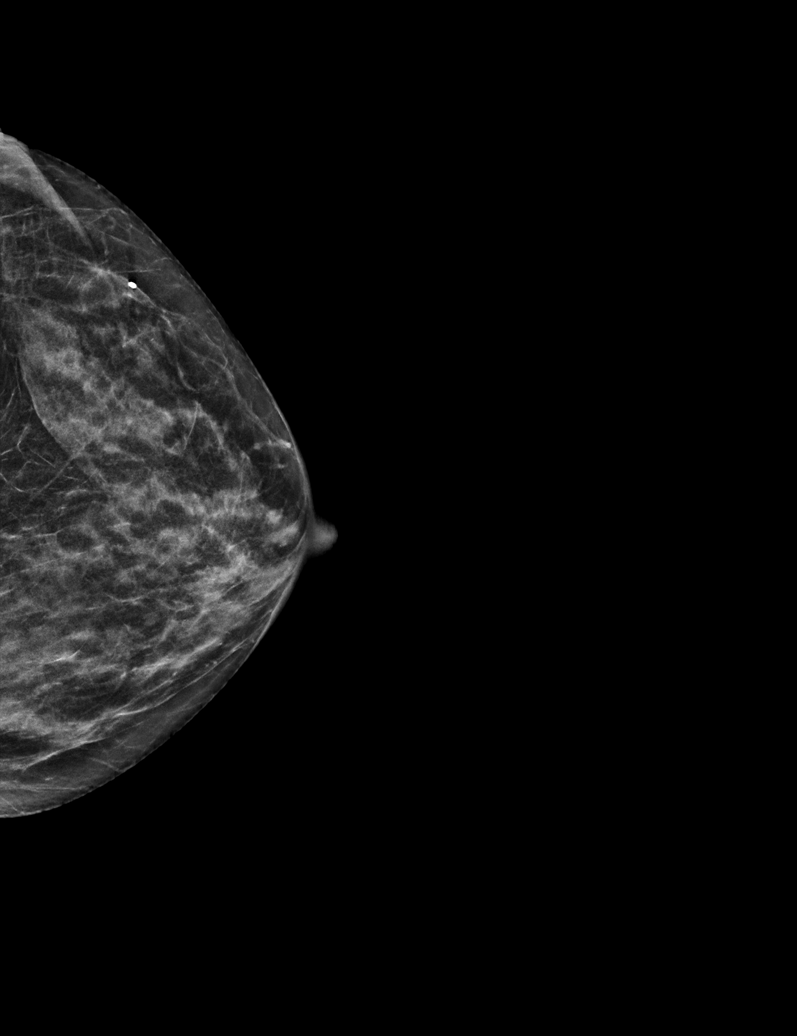

[L MLO synth-2D]
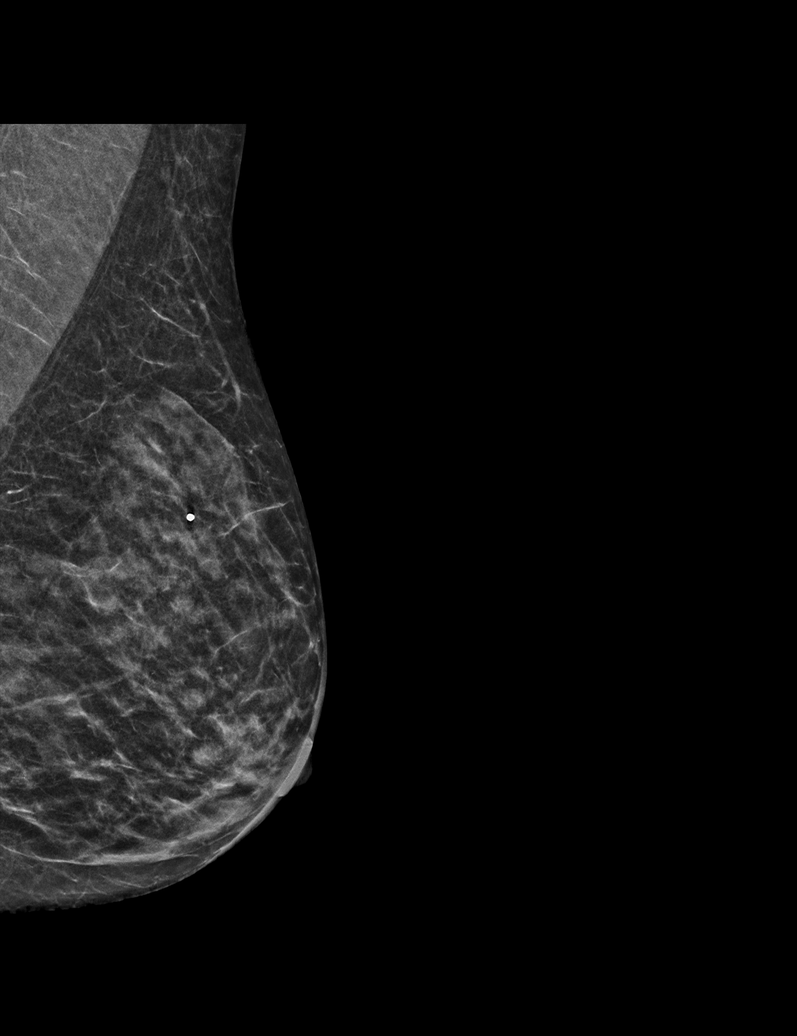

[R CC synth-2D (2 of 2)]
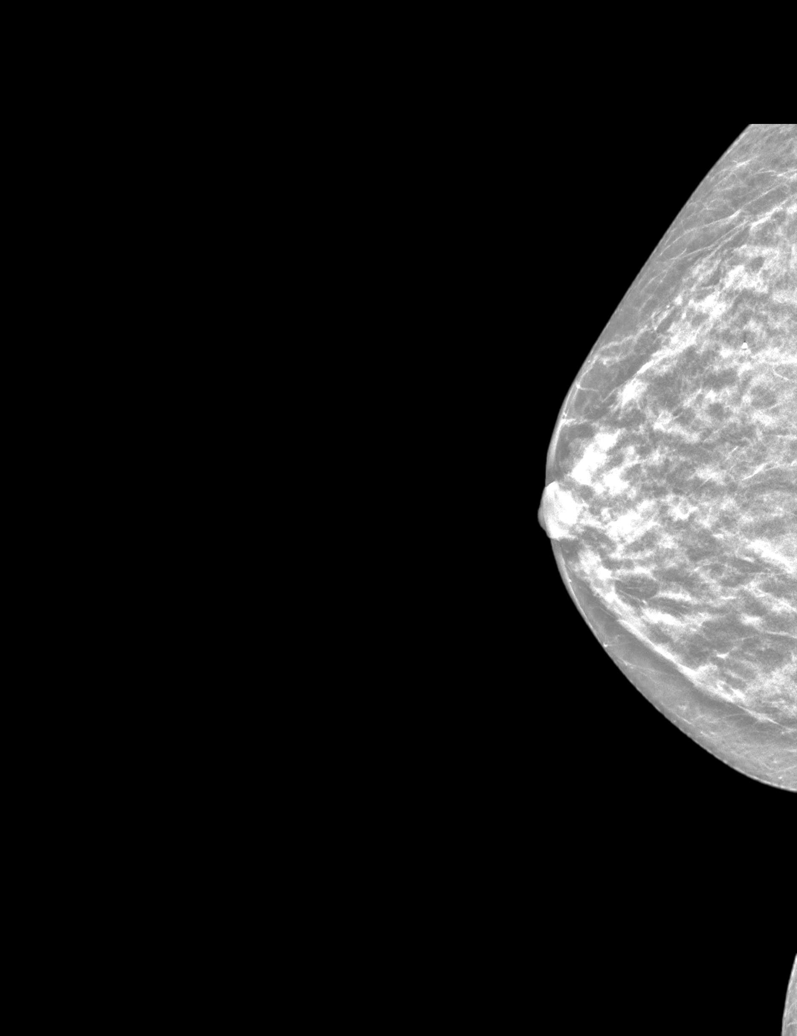

[L CC tomo · tomo slice 21/40.0]
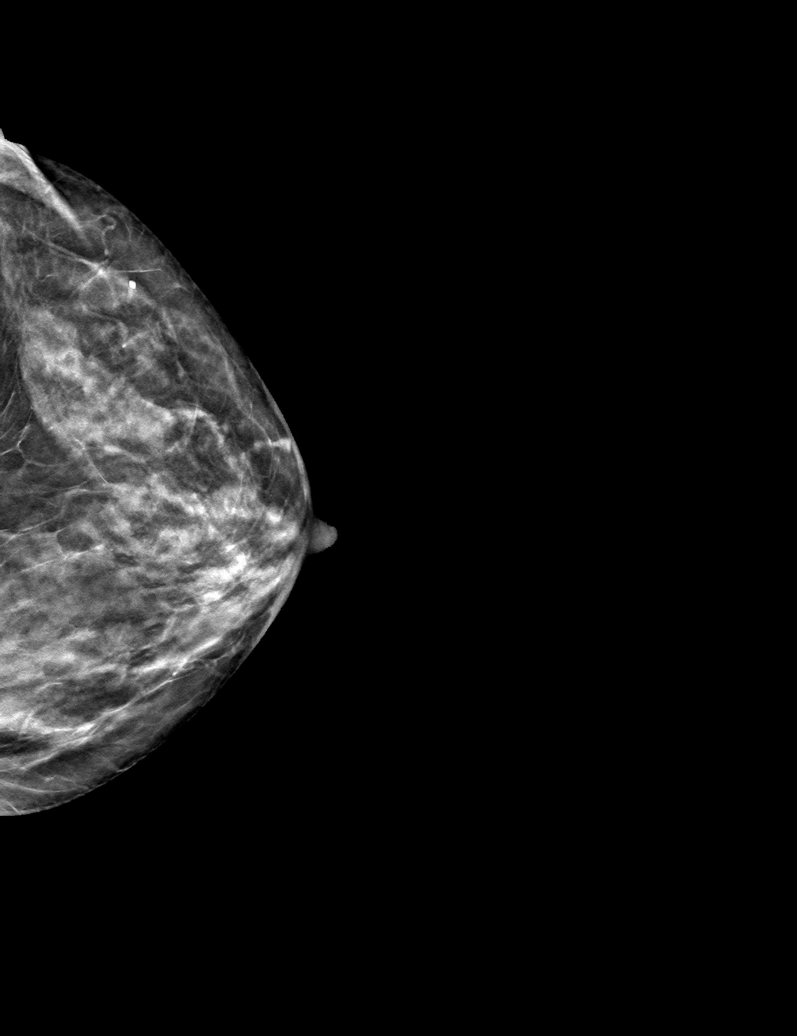

[6 of 30 positions shown; findings below may reference images not displayed]

ACR Breast Density Category c: The breast tissue is heterogeneously
dense, which may obscure small masses.
FINDINGS: There are no findings suspicious for malignancy. Images were
processed with CAD.
IMPRESSION: No mammographic evidence of malignancy. A result letter of this
screening mammogram will be mailed directly to the patient.

RECOMMENDATION:
Screening mammogram in one year. (Code:[id number])

BI-RADS CATEGORY  1: Negative.

## 2021-08-12 DIAGNOSIS — G43719 Chronic migraine without aura, intractable, without status migrainosus: Secondary | ICD-10-CM | POA: Diagnosis not present

## 2021-08-12 DIAGNOSIS — R52 Pain, unspecified: Secondary | ICD-10-CM | POA: Diagnosis not present

## 2021-08-19 DIAGNOSIS — G243 Spasmodic torticollis: Secondary | ICD-10-CM | POA: Diagnosis not present

## 2021-08-31 DIAGNOSIS — R52 Pain, unspecified: Secondary | ICD-10-CM | POA: Diagnosis not present

## 2021-08-31 DIAGNOSIS — G243 Spasmodic torticollis: Secondary | ICD-10-CM | POA: Diagnosis not present

## 2021-09-07 DIAGNOSIS — S0502XA Injury of conjunctiva and corneal abrasion without foreign body, left eye, initial encounter: Secondary | ICD-10-CM | POA: Diagnosis not present

## 2021-09-23 DIAGNOSIS — R0600 Dyspnea, unspecified: Secondary | ICD-10-CM | POA: Diagnosis not present

## 2021-09-23 DIAGNOSIS — R0602 Shortness of breath: Secondary | ICD-10-CM | POA: Diagnosis not present

## 2021-09-23 DIAGNOSIS — J479 Bronchiectasis, uncomplicated: Secondary | ICD-10-CM | POA: Diagnosis not present

## 2021-09-23 DIAGNOSIS — Z79899 Other long term (current) drug therapy: Secondary | ICD-10-CM | POA: Diagnosis not present

## 2021-10-08 ENCOUNTER — Other Ambulatory Visit: Payer: Self-pay | Admitting: Cardiovascular Disease

## 2021-10-31 ENCOUNTER — Encounter: Payer: Self-pay | Admitting: Cardiovascular Disease

## 2021-10-31 NOTE — Progress Notes (Signed)
Megan Michael Date of Birth  [birth year]       [facility] [address], [address] [place], [place], [place]  [address]   [place], [place]  [address] [place]   Fax  [phone]     Fax [phone]  Problem List: 1. Sinus tachycardia 2.  Migraine headaches (chronic )  3.   :  Megan Michael is a 66 yo who is seen for tachycardia.  She has noticed her HR is high.  She has lots of fatigue.  Also has chronic migraines. She does not get any regular exercise.  Does walk the dogs.    She has not had any heat or cold intolerance.  No unexplained weight gain or weight loss. No chronic diarrhea.    Dec. 30, 2015:  Megan Michael is a 66 yo with chronic sinus tachycardia.  No changes in her HR. HR stays active.  Typically in the 90's to 100s with household chores.   HR increases to 120 on the treadmill.  October 23, 2014:    Megan Michael is doing ok.  Is seen back for her sinus tachycardia .  Exercises without problems  - occasionally brings on a migraine.   No cardiac concerns  Oct. 4, 2017:  Doing well [name] her left meniscus , eventually had surgery in Feb.  Doing better now.    June 30, 2017:    Megan Michael is  seen today for follow-up of her sinus tachycardia and hyperlipidemia. Exercising regularly  Had a left tibial plateau fracture.   Has healed  Walks regularly  No CP or dyspnea  Had pneumonia in Dec.   Is on albuterol   July 10, 2019: Megan Michael is seen today for follow-up of her sinus tachycardia and hyperlipidemia.  I saw her several years ago. Exercises regularly .   No CP , no dyspnea.  Has some asthma  HR is slow today .  Will be reducing her dose of Toprol XL    October 29, 2020: Megan Michael is seen today for follow-up of her sinus tachycardia and hyperlipidemia.  She has been well controlled on Toprol-XL.  She exercises regularly. 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     AIMOVIG 140 MG/ML SOAJ Inject 1 Dose into the skin every 30 (thirty) days.     atorvastatin (LIPITOR) 20 MG tablet TAKE 1 TABLET BY MOUTH EVERY DAY 90 tablet 0   Azelaic Acid 15 % gel Apply 1 application topically at bedtime.     azithromycin (ZITHROMAX) 500 MG injection      buPROPion (WELLBUTRIN XL) 150 MG 24 hr tablet Take 450 mg by mouth in the morning.     Calcium Carb-Cholecalciferol (CALCIUM + D3 PO) Take 2 tablets by mouth in the morning.     escitalopram (LEXAPRO) 10 MG tablet Take 10 mg by mouth daily.     ethambutol (MYAMBUTOL)  400 MG tablet Take 600 mg by mouth daily.     gabapentin (NEURONTIN) 600 MG tablet Take 300-600 mg by mouth See admin instructions. Take 0.5 tablet (300 mg) by mouth in the morning & take 1 tablet (600 mg) by mouth at night  4   ibuprofen (ADVIL) 200 MG tablet Take 200 mg by mouth every 8 (eight) hours as needed (for pain.).     methocarbamol (ROBAXIN) 500 MG tablet Take 500 mg by mouth daily as needed (muscles spasms/headaches.).     metoprolol succinate (TOPROL-XL) 25 MG 24 hr tablet TAKE 2 TABLETS BY MOUTH EVERY DAY 180 tablet 2   NURTEC 75 MG TBDP Take 75 mg by mouth every 2 (two) hours as needed (at onset of migraine (max 3 tablet/24hrs)).     Probiotic Product (PROBIOTIC PO) Take 1 capsule by mouth daily in the afternoon.     promethazine (PHENERGAN) 12.5 MG tablet Take 12.5 mg by mouth every 6 (six) hours as needed for nausea or vomiting.     rizatriptan (MAXALT) 10 MG tablet Take 10 mg by mouth every 2 (two) hours as needed (at onset of migraine headaches (max 3  dose/24 hrs.)).     VENTOLIN HFA 108 (90 Base) MCG/ACT inhaler Inhale 2 puffs into the lungs every 6 (six) hours as needed for shortness of breath.  1   No current facility-administered medications on file prior to visit.    Allergies  Allergen Reactions   Penicillins Swelling and Rash    RASH AND TONGUE SWELLING   Amoxicillin-Pot Clavulanate Rash    "Patient reports it makes her break out".    Past Medical History:  Diagnosis Date   Asthma    Hyperlipidemia    Migraines    Sinus tachycardia     Past Surgical History:  Procedure Laterality Date   BRONCHIAL WASHINGS  02/04/2021   Procedure: BRONCHIAL WASHINGS;  Surgeon: Maryjane Hurter, MD;  Location: [facility];  Service: Pulmonary;;   DISK REPLACEMENT  2010   CERVICAL SPINE    VIDEO BRONCHOSCOPY N/A 02/04/2021   Procedure: VIDEO BRONCHOSCOPY WITHOUT FLUORO;  Surgeon: Maryjane Hurter, MD;  Location: [facility];  Service: Pulmonary;  Laterality: N/A;    Social History   Tobacco Use  Smoking Status Never  Smokeless Tobacco Never    Social History   Substance and Sexual Activity  Alcohol Use No   Comment: "not really"    Family History  Problem Relation Age of Onset   Hypertension Mother    Breast cancer Mother 51       w/ recurrence and metastasis to intestines at 68   Diabetes Mellitus I Father    Heart attack Father        +EtOH   Cancer - Prostate Father        dx. mid-late 66s   Skin cancer Father        NOS type dx. later age   Colon cancer Father        dx. after prostate cancer   Diabetes Mellitus I Brother    Prostate cancer Brother 25       unspecified Gleason score   Bipolar disorder Maternal Uncle    Stroke Maternal Grandfather    Heart disease Maternal Grandfather    Diabetes Mellitus I Brother    Prostate cancer Brother 30       unspecified Gleason score; +surgery   Other Brother        says he has  prostate cancer, but pt not so sure    Reviw of Systems:  Reviewed in the  HPI.  All other systems are negative.   Physical Exam: Blood pressure 110/70, pulse (!) 59, height _0  (1.626 m), weight 105 lb 12.8 oz (48 kg), SpO2 98 %.  GEN:  thin, middle age female  HEENT: Normal NECK: No JVD; No carotid bruits LYMPHATICS: No lymphadenopathy CARDIAC: RRR , systolic murmur at ULSB ,  does not radiate to her axillary line  RESPIRATORY:  clear to auscultation  ABDOMEN: Soft, non-tender, non-distended MUSCULOSKELETAL:  No edema; No deformity  SKIN: Warm and dry NEUROLOGIC:  Alert and oriented x 3   ECG:    November 02, 2021:  sinus bradycardia at 61.      Assessment and Plan :    1. Sinus tachycardia -  well controlled ,  will reduce the Toprol XL to 25 mg a day     2.   Hyperlipidemia :   lipids have been well controlled.     3.  MVP   - has a soft systolic murmur.   Will continue to watch    Mertie Moores, MD  11/02/2021 10:14 AM    [address],  [place], [place]  [address] Pager [phone] Phone: [phone]; Fax: [phone]

## 2021-11-02 ENCOUNTER — Encounter: Payer: Self-pay | Admitting: Cardiovascular Disease

## 2021-11-02 ENCOUNTER — Ambulatory Visit (INDEPENDENT_AMBULATORY_CARE_PROVIDER_SITE_OTHER): Payer: BC Managed Care – PPO | Admitting: Cardiovascular Disease

## 2021-11-02 VITALS — BP 110/70 | HR 59 | Ht 64.0 in | Wt 105.8 lb

## 2021-11-02 DIAGNOSIS — R000 Tachycardia, unspecified: Secondary | ICD-10-CM

## 2021-11-02 DIAGNOSIS — I340 Nonrheumatic mitral (valve) insufficiency: Secondary | ICD-10-CM | POA: Diagnosis not present

## 2021-11-02 MED ORDER — METOPROLOL SUCCINATE ER 25 MG PO TB24: 25.0000 mg | ORAL_TABLET | Freq: Every day | ORAL | 3 refills | Status: DC

## 2021-11-02 NOTE — Patient Instructions (Signed)
Medication Instructions:  DECREASE Toprol Xl (Metoprolol Succinate) to 25mg  daily *If you need a refill on your cardiac medications before your next appointment, please call your pharmacy*   Lab Work: NONE If you have labs (blood work) drawn today and your tests are completely normal, you will receive your results only by: MyChart Message (if you have MyChart) OR A paper copy in the mail If you have any lab test that is abnormal or we need to change your treatment, we will call you to review the results.   Testing/Procedures: NONE   Follow-Up: At [facility], you and your health needs are our priority.  As part of our continuing mission to provide you with exceptional heart care, we have created designated Provider Care Teams.  These Care Teams include your primary Cardiologist (physician) and Advanced Practice Providers (APPs -  Physician Assistants and Nurse Practitioners) who all work together to provide you with the care you need, when you need it.  We recommend signing up for the patient portal called "MyChart".  Sign up information is provided on this After Visit Summary.  MyChart is used to connect with patients for Virtual Visits (Telemedicine).  Patients are able to view lab/test results, encounter notes, upcoming appointments, etc.  Non-urgent messages can be sent to your provider as well.   To learn more about what you can do with MyChart, go to [facility].    Your next appointment:   1 year(s)  The format for your next appointment:   In Person  Provider:   [email or web address], or [place] {     Important Information About Sugar

## 2021-11-12 ENCOUNTER — Other Ambulatory Visit: Payer: Self-pay | Admitting: Cardiovascular Disease

## 2021-11-16 DIAGNOSIS — G243 Spasmodic torticollis: Secondary | ICD-10-CM | POA: Diagnosis not present

## 2021-11-30 DIAGNOSIS — G243 Spasmodic torticollis: Secondary | ICD-10-CM | POA: Diagnosis not present

## 2021-11-30 DIAGNOSIS — R52 Pain, unspecified: Secondary | ICD-10-CM | POA: Diagnosis not present

## 2021-12-07 DIAGNOSIS — S92424A Nondisplaced fracture of distal phalanx of right great toe, initial encounter for closed fracture: Secondary | ICD-10-CM | POA: Diagnosis not present

## 2021-12-14 DIAGNOSIS — M810 Age-related osteoporosis without current pathological fracture: Secondary | ICD-10-CM | POA: Diagnosis not present

## 2021-12-14 DIAGNOSIS — S92424D Nondisplaced fracture of distal phalanx of right great toe, subsequent encounter for fracture with routine healing: Secondary | ICD-10-CM | POA: Diagnosis not present

## 2022-02-24 DIAGNOSIS — G243 Spasmodic torticollis: Secondary | ICD-10-CM | POA: Diagnosis not present

## 2022-03-01 DIAGNOSIS — G243 Spasmodic torticollis: Secondary | ICD-10-CM | POA: Diagnosis not present

## 2022-03-01 DIAGNOSIS — R52 Pain, unspecified: Secondary | ICD-10-CM | POA: Diagnosis not present

## 2022-03-03 ENCOUNTER — Other Ambulatory Visit: Payer: Self-pay | Admitting: Cardiovascular Disease

## 2022-03-16 DIAGNOSIS — L820 Inflamed seborrheic keratosis: Secondary | ICD-10-CM | POA: Diagnosis not present

## 2022-03-16 DIAGNOSIS — L570 Actinic keratosis: Secondary | ICD-10-CM | POA: Diagnosis not present

## 2022-03-31 DIAGNOSIS — J479 Bronchiectasis, uncomplicated: Secondary | ICD-10-CM | POA: Diagnosis not present

## 2022-03-31 DIAGNOSIS — R0609 Other forms of dyspnea: Secondary | ICD-10-CM | POA: Diagnosis not present

## 2022-03-31 DIAGNOSIS — R0602 Shortness of breath: Secondary | ICD-10-CM | POA: Diagnosis not present

## 2022-03-31 DIAGNOSIS — J849 Interstitial pulmonary disease, unspecified: Secondary | ICD-10-CM | POA: Diagnosis not present

## 2022-03-31 DIAGNOSIS — R5381 Other malaise: Secondary | ICD-10-CM | POA: Diagnosis not present

## 2022-05-16 DIAGNOSIS — G243 Spasmodic torticollis: Secondary | ICD-10-CM | POA: Diagnosis not present

## 2022-05-18 DIAGNOSIS — N950 Postmenopausal bleeding: Secondary | ICD-10-CM | POA: Diagnosis not present

## 2022-05-24 DIAGNOSIS — R52 Pain, unspecified: Secondary | ICD-10-CM | POA: Diagnosis not present

## 2022-05-24 DIAGNOSIS — G243 Spasmodic torticollis: Secondary | ICD-10-CM | POA: Diagnosis not present

## 2022-06-08 DIAGNOSIS — N952 Postmenopausal atrophic vaginitis: Secondary | ICD-10-CM | POA: Diagnosis not present

## 2022-06-08 DIAGNOSIS — N950 Postmenopausal bleeding: Secondary | ICD-10-CM | POA: Diagnosis not present

## 2022-06-24 DIAGNOSIS — H9319 Tinnitus, unspecified ear: Secondary | ICD-10-CM | POA: Diagnosis not present

## 2022-06-24 DIAGNOSIS — M8588 Other specified disorders of bone density and structure, other site: Secondary | ICD-10-CM | POA: Diagnosis not present

## 2022-06-24 DIAGNOSIS — H9190 Unspecified hearing loss, unspecified ear: Secondary | ICD-10-CM | POA: Diagnosis not present

## 2022-07-26 DIAGNOSIS — H903 Sensorineural hearing loss, bilateral: Secondary | ICD-10-CM | POA: Diagnosis not present

## 2022-08-01 ENCOUNTER — Encounter: Payer: Self-pay | Admitting: *Deleted

## 2022-08-08 DIAGNOSIS — G243 Spasmodic torticollis: Secondary | ICD-10-CM | POA: Diagnosis not present

## 2022-08-08 DIAGNOSIS — M8580 Other specified disorders of bone density and structure, unspecified site: Secondary | ICD-10-CM | POA: Diagnosis not present

## 2022-08-16 DIAGNOSIS — R52 Pain, unspecified: Secondary | ICD-10-CM | POA: Diagnosis not present

## 2022-08-16 DIAGNOSIS — G243 Spasmodic torticollis: Secondary | ICD-10-CM | POA: Diagnosis not present

## 2022-08-23 DIAGNOSIS — D1801 Hemangioma of skin and subcutaneous tissue: Secondary | ICD-10-CM | POA: Diagnosis not present

## 2022-08-23 DIAGNOSIS — C4442 Squamous cell carcinoma of skin of scalp and neck: Secondary | ICD-10-CM | POA: Diagnosis not present

## 2022-08-23 DIAGNOSIS — L820 Inflamed seborrheic keratosis: Secondary | ICD-10-CM | POA: Diagnosis not present

## 2022-08-23 DIAGNOSIS — L821 Other seborrheic keratosis: Secondary | ICD-10-CM | POA: Diagnosis not present

## 2022-08-23 DIAGNOSIS — C4441 Basal cell carcinoma of skin of scalp and neck: Secondary | ICD-10-CM | POA: Diagnosis not present

## 2022-09-22 DIAGNOSIS — C44329 Squamous cell carcinoma of skin of other parts of face: Secondary | ICD-10-CM | POA: Diagnosis not present

## 2022-10-27 DIAGNOSIS — G243 Spasmodic torticollis: Secondary | ICD-10-CM | POA: Diagnosis not present

## 2022-11-08 DIAGNOSIS — R52 Pain, unspecified: Secondary | ICD-10-CM | POA: Diagnosis not present

## 2022-11-08 DIAGNOSIS — G243 Spasmodic torticollis: Secondary | ICD-10-CM | POA: Diagnosis not present

## 2022-12-03 ENCOUNTER — Other Ambulatory Visit: Payer: Self-pay | Admitting: Cardiovascular Disease

## 2022-12-04 ENCOUNTER — Other Ambulatory Visit: Payer: Self-pay | Admitting: Cardiovascular Disease

## 2022-12-21 DIAGNOSIS — Z85828 Personal history of other malignant neoplasm of skin: Secondary | ICD-10-CM | POA: Diagnosis not present

## 2022-12-21 DIAGNOSIS — L570 Actinic keratosis: Secondary | ICD-10-CM | POA: Diagnosis not present

## 2022-12-21 DIAGNOSIS — L820 Inflamed seborrheic keratosis: Secondary | ICD-10-CM | POA: Diagnosis not present

## 2023-01-09 ENCOUNTER — Other Ambulatory Visit: Payer: Self-pay | Admitting: Cardiovascular Disease

## 2023-01-09 DIAGNOSIS — R059 Cough, unspecified: Secondary | ICD-10-CM | POA: Diagnosis not present

## 2023-01-09 DIAGNOSIS — R0602 Shortness of breath: Secondary | ICD-10-CM | POA: Diagnosis not present

## 2023-01-09 DIAGNOSIS — J479 Bronchiectasis, uncomplicated: Secondary | ICD-10-CM | POA: Diagnosis not present

## 2023-01-09 DIAGNOSIS — R918 Other nonspecific abnormal finding of lung field: Secondary | ICD-10-CM | POA: Diagnosis not present

## 2023-01-09 DIAGNOSIS — Z23 Encounter for immunization: Secondary | ICD-10-CM | POA: Diagnosis not present

## 2023-01-24 DIAGNOSIS — G243 Spasmodic torticollis: Secondary | ICD-10-CM | POA: Diagnosis not present

## 2023-01-26 DIAGNOSIS — C44319 Basal cell carcinoma of skin of other parts of face: Secondary | ICD-10-CM | POA: Diagnosis not present

## 2023-01-26 DIAGNOSIS — L820 Inflamed seborrheic keratosis: Secondary | ICD-10-CM | POA: Diagnosis not present

## 2023-01-26 DIAGNOSIS — L718 Other rosacea: Secondary | ICD-10-CM | POA: Diagnosis not present

## 2023-01-26 DIAGNOSIS — L821 Other seborrheic keratosis: Secondary | ICD-10-CM | POA: Diagnosis not present

## 2023-01-26 DIAGNOSIS — Z85828 Personal history of other malignant neoplasm of skin: Secondary | ICD-10-CM | POA: Diagnosis not present

## 2023-01-31 DIAGNOSIS — R52 Pain, unspecified: Secondary | ICD-10-CM | POA: Diagnosis not present

## 2023-01-31 DIAGNOSIS — G243 Spasmodic torticollis: Secondary | ICD-10-CM | POA: Diagnosis not present

## 2023-02-09 DIAGNOSIS — M818 Other osteoporosis without current pathological fracture: Secondary | ICD-10-CM | POA: Diagnosis not present

## 2023-03-20 DIAGNOSIS — C44319 Basal cell carcinoma of skin of other parts of face: Secondary | ICD-10-CM | POA: Diagnosis not present

## 2023-04-04 ENCOUNTER — Other Ambulatory Visit: Payer: Self-pay | Admitting: Cardiovascular Disease

## 2023-04-04 NOTE — Telephone Encounter (Signed)
Spoke to Dr. Harvie Bridge nurse, deny refill at this time until patient makes an appointment.

## 2023-04-06 DIAGNOSIS — G243 Spasmodic torticollis: Secondary | ICD-10-CM | POA: Diagnosis not present

## 2023-04-10 ENCOUNTER — Other Ambulatory Visit: Payer: Self-pay | Admitting: Cardiovascular Disease

## 2023-04-25 DIAGNOSIS — R52 Pain, unspecified: Secondary | ICD-10-CM | POA: Diagnosis not present

## 2023-04-25 DIAGNOSIS — G243 Spasmodic torticollis: Secondary | ICD-10-CM | POA: Diagnosis not present

## 2023-05-16 ENCOUNTER — Other Ambulatory Visit: Payer: Self-pay

## 2023-05-16 MED ORDER — METOPROLOL SUCCINATE ER 25 MG PO TB24: 50.0000 mg | ORAL_TABLET | Freq: Every day | ORAL | 0 refills | Status: DC

## 2023-05-19 ENCOUNTER — Ambulatory Visit: Payer: BC Managed Care – PPO | Attending: Cardiovascular Disease | Admitting: Cardiovascular Disease

## 2023-05-19 ENCOUNTER — Encounter: Payer: Self-pay | Admitting: Cardiovascular Disease

## 2023-05-19 VITALS — BP 128/68 | HR 74 | Ht 64.0 in | Wt 125.2 lb

## 2023-05-19 DIAGNOSIS — I340 Nonrheumatic mitral (valve) insufficiency: Secondary | ICD-10-CM

## 2023-05-19 DIAGNOSIS — R000 Tachycardia, unspecified: Secondary | ICD-10-CM | POA: Diagnosis not present

## 2023-05-19 MED ORDER — PROPRANOLOL HCL 10 MG PO TABS: 10.0000 mg | ORAL_TABLET | Freq: Four times a day (QID) | ORAL | 3 refills | Status: DC | PRN

## 2023-05-19 NOTE — Patient Instructions (Signed)
Medication Instructions:  Propranolol 10 mg four times daily as needed *If you need a refill on your cardiac medications before your next appointment, please call your pharmacy*  Testing/Procedures: ECHO Your physician has requested that you have an echocardiogram. Echocardiography is a painless test that uses sound waves to create images of your heart. It provides your doctor with information about the size and shape of your heart and how well your heart's chambers and valves are working. This procedure takes approximately one hour. There are no restrictions for this procedure. Please do NOT wear cologne, perfume, aftershave, or lotions (deodorant is allowed). Please arrive 15 minutes prior to your appointment time.  Please note: We ask at that you not bring children with you during ultrasound (echo/ vascular) testing. Due to room size and safety concerns, children are not allowed in the ultrasound rooms during exams. Our front office staff cannot provide observation of children in our lobby area while testing is being conducted. An adult accompanying a patient to their appointment will only be allowed in the ultrasound room at the discretion of the ultrasound technician under special circumstances. We apologize for any inconvenience.   Follow-Up: At [facility], you and your health needs are our priority.  As part of our continuing mission to provide you with exceptional heart care, we have created designated Provider Care Teams.  These Care Teams include your primary Cardiologist (physician) and Advanced Practice Providers (APPs -  Physician Assistants and Nurse Practitioners) who all work together to provide you with the care you need, when you need it.  We recommend signing up for the patient portal called "MyChart".  Sign up information is provided on this After Visit Summary.  MyChart is used to connect with patients for Virtual Visits (Telemedicine).  Patients are able to view  lab/test results, encounter notes, upcoming appointments, etc.  Non-urgent messages can be sent to your provider as well.   To learn more about what you can do with MyChart, go to [email or web address].    Your next appointment:   1 year(s)  Provider:   Dr. Izora Ribas  Other Instructions   1st Floor: - Lobby - Registration  - Pharmacy  - Lab - Cafe  2nd Floor: - PV Lab - Diagnostic Testing (echo, CT, nuclear med)  3rd Floor: - Vacant  4th Floor: - TCTS (cardiothoracic surgery) - AFib Clinic - Structural Heart Clinic - Vascular Surgery  - Vascular Ultrasound  5th Floor: - HeartCare Cardiology (general and EP) - Clinical Pharmacy for coumadin, hypertension, lipid, weight-loss medications, and med management appointments    Valet parking services will be available as well.

## 2023-05-19 NOTE — Progress Notes (Signed)
Megan Michael Date of Birth  10-22-1955       [facility] Office [address]  [address] [place], [place]  [address]   [place], [place]  [address] [phone]     [phone]   Fax  [phone]     Fax [phone]  Problem List: 1. Sinus tachycardia 2.  Migraine headaches (chronic )  3.   :  Megan Michael is a 68 yo who is seen for tachycardia.  She has noticed her HR is high.  She has lots of fatigue.  Also has chronic migraines. She does not get any regular exercise.  Does walk the dogs.    She has not had any heat or cold intolerance.  No unexplained weight gain or weight loss. No chronic diarrhea.    Dec. 30, 2015:  Megan Michael is a 68 yo with chronic sinus tachycardia.  No changes in her HR. HR stays active.  Typically in the 90's to 100s with household chores.   HR increases to 120 on the treadmill.  October 23, 2014:    Megan Michael is doing ok.  Is seen back for her sinus tachycardia .  Exercises without problems  - occasionally brings on a migraine.   No cardiac concerns  Oct. 4, 2017:  Doing well [name] her left meniscus , eventually had surgery in Feb.  Doing better now.    June 30, 2017:    Megan Michael is  seen today for follow-up of her sinus tachycardia and hyperlipidemia. Exercising regularly  Had a left tibial plateau fracture.   Has healed  Walks regularly  No CP or dyspnea  Had pneumonia in Dec.   Is on albuterol   July 10, 2019: Megan Michael is seen today for follow-up of her sinus tachycardia and hyperlipidemia.  I saw her several years ago. Exercises regularly .   No CP , no dyspnea.  Has some asthma  HR is slow today .  Will be reducing her dose of Toprol XL    October 29, 2020: Megan Michael is seen today for follow-up of her sinus tachycardia and hyperlipidemia.  She has been well controlled on Toprol-XL.  She exercises regularly. 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  She has atypical Mycobacterium lung infection. She is followed at [facility]. Wt is 125 lbs ,    Her MAI seems to be getting better       Current Outpatient Medications on File Prior to Visit  Medication Sig Dispense Refill   acetaminophen (TYLENOL) 500 MG tablet Take 500 mg by mouth every 6 (six) hours as needed (pain.).     AIMOVIG 140 MG/ML SOAJ Inject 1 Dose into the skin every 30 (thirty) days.     atorvastatin (LIPITOR) 20 MG tablet TAKE 1 TABLET BY MOUTH EVERY DAY 15 tablet 0   Azelaic Acid 15 % gel Apply 1 application topically at bedtime.     azithromycin (ZITHROMAX) 500 MG tablet Take 500 mg by mouth daily.     buPROPion (  WELLBUTRIN XL) 150 MG 24 hr tablet Take 450 mg by mouth in the morning.     Calcium Carb-Cholecalciferol (CALCIUM + D3 PO) Take 2 tablets by mouth in the morning.     escitalopram (LEXAPRO) 10 MG tablet Take 10 mg by mouth daily.     ethambutol (MYAMBUTOL) 400 MG tablet Take 600 mg by mouth daily.     gabapentin (NEURONTIN) 600 MG tablet Take 300-600 mg by mouth See admin instructions. Take 0.5 tablet (300 mg) by mouth in the morning & take 1 tablet (600 mg) by mouth at night  4   ibuprofen (ADVIL) 200 MG tablet Take 200 mg by mouth every 8 (eight) hours as needed (for pain.).     methocarbamol (ROBAXIN) 500 MG tablet Take 500 mg by mouth daily as needed (muscles spasms/headaches.).     metoprolol succinate (TOPROL-XL) 25 MG 24 hr tablet Take 2 tablets (50 mg total) by mouth daily. 30 tablet 0   NURTEC 75 MG TBDP Take 75 mg by mouth every 2 (two) hours as needed (at onset of migraine (max 3 tablet/24hrs)).     Probiotic Product (PROBIOTIC PO) Take 1  capsule by mouth daily in the afternoon.     promethazine (PHENERGAN) 25 MG tablet TAKE 1 TO 2 TABLETS BY MOUTH EVERY SIX TO EIGHT HOURS AS DIRECTED     rizatriptan (MAXALT) 10 MG tablet Take 10 mg by mouth every 2 (two) hours as needed (at onset of migraine headaches (max 3 dose/24 hrs.)).     VENTOLIN HFA 108 (90 Base) MCG/ACT inhaler Inhale 2 puffs into the lungs every 6 (six) hours as needed for shortness of breath.  1   No current facility-administered medications on file prior to visit.    Allergies  Allergen Reactions   Penicillins Swelling and Rash    RASH AND TONGUE SWELLING   Amoxicillin-Pot Clavulanate Rash    "Patient reports it makes her break out".    Past Medical History:  Diagnosis Date   Asthma    Hyperlipidemia    Migraines    Sinus tachycardia     Past Surgical History:  Procedure Laterality Date   BRONCHIAL WASHINGS  02/04/2021   Procedure: BRONCHIAL WASHINGS;  Surgeon: Omar Person, MD;  Location: [facility];  Service: Pulmonary;;   DISK REPLACEMENT  2010   CERVICAL SPINE    VIDEO BRONCHOSCOPY N/A 02/04/2021   Procedure: VIDEO BRONCHOSCOPY WITHOUT FLUORO;  Surgeon: Omar Person, MD;  Location: [facility];  Service: Pulmonary;  Laterality: N/A;    Social History   Tobacco Use  Smoking Status Never  Smokeless Tobacco Never    Social History   Substance and Sexual Activity  Alcohol Use No   Comment: "not really"    Family History  Problem Relation Age of Onset   Hypertension Mother    Breast cancer Mother 73       w/ recurrence and metastasis to intestines at 72   Diabetes Mellitus I Father    Heart attack Father        +EtOH   Cancer - Prostate Father        dx. mid-late 76s   Skin cancer Father        NOS type dx. later age   Colon cancer Father        dx. after prostate cancer   Diabetes Mellitus I Brother    Prostate cancer Brother 32       unspecified Gleason score  Bipolar disorder Maternal Uncle    Stroke  Maternal Grandfather    Heart disease Maternal Grandfather    Diabetes Mellitus I Brother    Prostate cancer Brother 29       unspecified Gleason score; +surgery   Other Brother        says he has prostate cancer, but pt not so sure    Reviw of Systems:  Reviewed in the HPI.  All other systems are negative.   Physical Exam: Blood pressure 128/68, pulse 74, height 5\' 4"  (1.626 m), weight 125 lb 3.2 oz (56.8 kg), SpO2 97%.       GEN:  Well nourished, well developed in no acute distress HEENT: Normal NECK: No JVD; No carotid bruits LYMPHATICS: No lymphadenopathy CARDIAC: RRR , soft systolic murmur .  RESPIRATORY:  Clear to auscultation without rales, wheezing or rhonchi  ABDOMEN: Soft, non-tender, non-distended MUSCULOSKELETAL:  No edema; No deformity  SKIN: Warm and dry NEUROLOGIC:  Alert and oriented x 3   ECG:    EKG Interpretation Date/Time:  Friday May 19 2023 11:25:17 EST Ventricular Rate:  79 PR Interval:  144 QRS Duration:  76 QT Interval:  414 QTC Calculation: 474 R Axis:   86  Text Interpretation: Normal sinus rhythm Normal ECG No previous ECGs available Confirmed by [name] ([id number]) on 05/19/2023 11:26:47 AM     Assessment and Plan :    1. Sinus tachycardia -   sinus tachycardia is much better controlled.  She still has occasional episodes of stress that causes her heart rate to race.  She described an episode while at the dentist recently.  Will give her propranolol 10 mg to take on an as-needed basis.  She will continue the Toprol-XL 25 mg a day.  If she finds that she is using lots of propranolol, I have suggested that we should increase her Toprol-XL dose.    2.   Hyperlipidemia :    Lipids are managed by her primary MD  Continue atorvastatin .       3.  Mitral regurgitation :   stable,  will repeat echo   4.  Aortic insufficiency :  had moderate AI in 2015. I do not hear a significant diastolic murmur .  Will repeat echo     [name], MD  05/19/2023 11:31 AM    [facility] [address] [place],  [address] [place], [place]  [address] Pager [phone] Phone: [phone]; Fax: [phone]

## 2023-05-21 ENCOUNTER — Other Ambulatory Visit: Payer: Self-pay | Admitting: Cardiovascular Disease

## 2023-05-22 MED ORDER — METOPROLOL SUCCINATE ER 25 MG PO TB24: 50.0000 mg | ORAL_TABLET | Freq: Every day | ORAL | 3 refills | Status: AC

## 2023-06-08 ENCOUNTER — Ambulatory Visit (HOSPITAL_COMMUNITY): Payer: BC Managed Care – PPO

## 2023-06-22 ENCOUNTER — Ambulatory Visit (HOSPITAL_COMMUNITY): Payer: BC Managed Care – PPO

## 2023-06-22 ENCOUNTER — Encounter (HOSPITAL_COMMUNITY): Payer: Self-pay

## 2023-07-05 DIAGNOSIS — Z0000 Encounter for general adult medical examination without abnormal findings: Secondary | ICD-10-CM | POA: Diagnosis not present

## 2023-07-05 DIAGNOSIS — E559 Vitamin D deficiency, unspecified: Secondary | ICD-10-CM | POA: Diagnosis not present

## 2023-07-06 DIAGNOSIS — G243 Spasmodic torticollis: Secondary | ICD-10-CM | POA: Diagnosis not present

## 2023-07-17 DIAGNOSIS — J9811 Atelectasis: Secondary | ICD-10-CM | POA: Diagnosis not present

## 2023-07-17 DIAGNOSIS — R0609 Other forms of dyspnea: Secondary | ICD-10-CM | POA: Diagnosis not present

## 2023-07-17 DIAGNOSIS — J479 Bronchiectasis, uncomplicated: Secondary | ICD-10-CM | POA: Diagnosis not present

## 2023-07-17 DIAGNOSIS — A310 Pulmonary mycobacterial infection: Secondary | ICD-10-CM | POA: Diagnosis not present

## 2023-07-17 DIAGNOSIS — R0602 Shortness of breath: Secondary | ICD-10-CM | POA: Diagnosis not present

## 2023-07-18 DIAGNOSIS — G243 Spasmodic torticollis: Secondary | ICD-10-CM | POA: Diagnosis not present

## 2023-07-18 DIAGNOSIS — R52 Pain, unspecified: Secondary | ICD-10-CM | POA: Diagnosis not present

## 2023-08-11 DIAGNOSIS — M818 Other osteoporosis without current pathological fracture: Secondary | ICD-10-CM | POA: Diagnosis not present

## 2023-08-17 DIAGNOSIS — J479 Bronchiectasis, uncomplicated: Secondary | ICD-10-CM | POA: Diagnosis not present

## 2023-08-22 DIAGNOSIS — M7541 Impingement syndrome of right shoulder: Secondary | ICD-10-CM | POA: Diagnosis not present

## 2023-08-22 DIAGNOSIS — M542 Cervicalgia: Secondary | ICD-10-CM | POA: Diagnosis not present

## 2023-08-22 DIAGNOSIS — M1712 Unilateral primary osteoarthritis, left knee: Secondary | ICD-10-CM | POA: Diagnosis not present

## 2023-09-05 DIAGNOSIS — S46011A Strain of muscle(s) and tendon(s) of the rotator cuff of right shoulder, initial encounter: Secondary | ICD-10-CM | POA: Diagnosis not present

## 2023-09-17 ENCOUNTER — Other Ambulatory Visit: Payer: Self-pay | Admitting: Cardiovascular Disease

## 2023-09-20 DIAGNOSIS — M25511 Pain in right shoulder: Secondary | ICD-10-CM | POA: Diagnosis not present

## 2023-10-02 DIAGNOSIS — G243 Spasmodic torticollis: Secondary | ICD-10-CM | POA: Diagnosis not present

## 2023-10-03 DIAGNOSIS — M25511 Pain in right shoulder: Secondary | ICD-10-CM | POA: Diagnosis not present

## 2023-10-10 DIAGNOSIS — G243 Spasmodic torticollis: Secondary | ICD-10-CM | POA: Diagnosis not present

## 2023-10-10 DIAGNOSIS — R52 Pain, unspecified: Secondary | ICD-10-CM | POA: Diagnosis not present

## 2023-10-30 DIAGNOSIS — D225 Melanocytic nevi of trunk: Secondary | ICD-10-CM | POA: Diagnosis not present

## 2023-10-30 DIAGNOSIS — L858 Other specified epidermal thickening: Secondary | ICD-10-CM | POA: Diagnosis not present

## 2023-10-30 DIAGNOSIS — L570 Actinic keratosis: Secondary | ICD-10-CM | POA: Diagnosis not present

## 2023-10-30 DIAGNOSIS — L821 Other seborrheic keratosis: Secondary | ICD-10-CM | POA: Diagnosis not present

## 2023-10-30 DIAGNOSIS — Z85828 Personal history of other malignant neoplasm of skin: Secondary | ICD-10-CM | POA: Diagnosis not present

## 2024-01-02 DIAGNOSIS — G243 Spasmodic torticollis: Secondary | ICD-10-CM | POA: Diagnosis not present

## 2024-01-19 DIAGNOSIS — G43719 Chronic migraine without aura, intractable, without status migrainosus: Secondary | ICD-10-CM | POA: Diagnosis not present

## 2024-01-19 DIAGNOSIS — R52 Pain, unspecified: Secondary | ICD-10-CM | POA: Diagnosis not present

## 2024-01-24 DIAGNOSIS — R0602 Shortness of breath: Secondary | ICD-10-CM | POA: Diagnosis not present

## 2024-01-24 DIAGNOSIS — J479 Bronchiectasis, uncomplicated: Secondary | ICD-10-CM | POA: Diagnosis not present

## 2024-03-12 DIAGNOSIS — Z23 Encounter for immunization: Secondary | ICD-10-CM | POA: Diagnosis not present

## 2024-03-12 DIAGNOSIS — M818 Other osteoporosis without current pathological fracture: Secondary | ICD-10-CM | POA: Diagnosis not present

## 2024-03-29 ENCOUNTER — Encounter: Payer: Self-pay | Admitting: Internal Medicine

## 2024-04-03 DIAGNOSIS — G243 Spasmodic torticollis: Secondary | ICD-10-CM | POA: Diagnosis not present

## 2024-05-14 ENCOUNTER — Other Ambulatory Visit: Payer: Self-pay | Admitting: Physician Assistant

## 2024-05-14 MED ORDER — ATORVASTATIN CALCIUM 20 MG PO TABS: 20.0000 mg | ORAL_TABLET | Freq: Every day | ORAL | 0 refills | Status: AC
